# Patient Record
Sex: Female | Born: 1950
Health system: Southern US, Community
[De-identification: ages and names within clinical notes are randomized; demographics above are authoritative.]

## PROBLEM LIST (undated history)

## (undated) DIAGNOSIS — E215 Disorder of parathyroid gland, unspecified: Secondary | ICD-10-CM

## (undated) DIAGNOSIS — Z803 Family history of malignant neoplasm of breast: Secondary | ICD-10-CM

## (undated) DIAGNOSIS — M1990 Unspecified osteoarthritis, unspecified site: Secondary | ICD-10-CM

## (undated) DIAGNOSIS — I10 Essential (primary) hypertension: Secondary | ICD-10-CM

## (undated) DIAGNOSIS — Z923 Personal history of irradiation: Secondary | ICD-10-CM

## (undated) DIAGNOSIS — C801 Malignant (primary) neoplasm, unspecified: Secondary | ICD-10-CM

## (undated) DIAGNOSIS — Z8041 Family history of malignant neoplasm of ovary: Secondary | ICD-10-CM

## (undated) DIAGNOSIS — D649 Anemia, unspecified: Secondary | ICD-10-CM

## (undated) DIAGNOSIS — Z800 Family history of malignant neoplasm of digestive organs: Secondary | ICD-10-CM

## (undated) HISTORY — PX: HYSTERECTOMY ABDOMINAL WITH SALPINGECTOMY: SHX6725

## (undated) HISTORY — PX: BREAST LUMPECTOMY: SHX2

## (undated) HISTORY — DX: Family history of malignant neoplasm of digestive organs: Z80.0

## (undated) HISTORY — PX: WISDOM TOOTH EXTRACTION: SHX21

## (undated) HISTORY — DX: Family history of malignant neoplasm of breast: Z80.3

## (undated) HISTORY — PX: ABDOMINAL HYSTERECTOMY: SHX81

## (undated) HISTORY — PX: PARATHYROIDECTOMY: SHX19

## (undated) HISTORY — DX: Family history of malignant neoplasm of ovary: Z80.41

---

## 2018-03-23 ENCOUNTER — Other Ambulatory Visit: Payer: Self-pay | Admitting: Family Medicine

## 2018-03-23 DIAGNOSIS — C801 Malignant (primary) neoplasm, unspecified: Secondary | ICD-10-CM

## 2018-03-23 DIAGNOSIS — N6320 Unspecified lump in the left breast, unspecified quadrant: Secondary | ICD-10-CM

## 2018-03-23 HISTORY — DX: Malignant (primary) neoplasm, unspecified: C80.1

## 2018-03-29 ENCOUNTER — Other Ambulatory Visit: Payer: Self-pay | Admitting: Family Medicine

## 2018-03-29 DIAGNOSIS — N6320 Unspecified lump in the left breast, unspecified quadrant: Secondary | ICD-10-CM

## 2018-03-31 ENCOUNTER — Other Ambulatory Visit: Payer: Self-pay | Admitting: Family Medicine

## 2018-03-31 ENCOUNTER — Other Ambulatory Visit: Payer: Self-pay

## 2018-03-31 ENCOUNTER — Ambulatory Visit
Admission: RE | Admit: 2018-03-31 | Discharge: 2018-03-31 | Disposition: A | Discharge status: Home / Self Care | Payer: Medicare Other | Source: Ambulatory Visit | Attending: Family Medicine | Admitting: Family Medicine

## 2018-03-31 DIAGNOSIS — N6320 Unspecified lump in the left breast, unspecified quadrant: Secondary | ICD-10-CM

## 2018-04-05 ENCOUNTER — Encounter: Payer: Self-pay | Admitting: *Deleted

## 2018-04-05 DIAGNOSIS — Z170 Estrogen receptor positive status [ER+]: Principal | ICD-10-CM | POA: Insufficient documentation

## 2018-04-05 DIAGNOSIS — C50412 Malignant neoplasm of upper-outer quadrant of left female breast: Secondary | ICD-10-CM

## 2018-04-12 ENCOUNTER — Other Ambulatory Visit: Payer: Self-pay | Admitting: Surgery

## 2018-04-12 ENCOUNTER — Ambulatory Visit: Payer: Self-pay | Admitting: Surgery

## 2018-04-12 ENCOUNTER — Encounter: Payer: Self-pay | Admitting: Physical Therapy

## 2018-04-12 ENCOUNTER — Ambulatory Visit: Payer: Medicare Other | Attending: Surgery | Admitting: Physical Therapy

## 2018-04-12 ENCOUNTER — Other Ambulatory Visit: Payer: Self-pay

## 2018-04-12 ENCOUNTER — Ambulatory Visit
Admission: RE | Admit: 2018-04-12 | Discharge: 2018-04-12 | Disposition: A | Discharge status: Home / Self Care | Payer: Medicare Other | Source: Ambulatory Visit | Attending: Radiation Oncology | Admitting: Radiation Oncology

## 2018-04-12 ENCOUNTER — Inpatient Hospital Stay: Payer: Medicare Other

## 2018-04-12 ENCOUNTER — Inpatient Hospital Stay: Payer: Medicare Other | Attending: Hematology and Oncology | Admitting: Hematology and Oncology

## 2018-04-12 ENCOUNTER — Encounter: Payer: Self-pay | Admitting: *Deleted

## 2018-04-12 ENCOUNTER — Encounter: Payer: Self-pay | Admitting: Hematology and Oncology

## 2018-04-12 DIAGNOSIS — C50412 Malignant neoplasm of upper-outer quadrant of left female breast: Secondary | ICD-10-CM | POA: Diagnosis not present

## 2018-04-12 DIAGNOSIS — Z170 Estrogen receptor positive status [ER+]: Principal | ICD-10-CM

## 2018-04-12 DIAGNOSIS — C50912 Malignant neoplasm of unspecified site of left female breast: Secondary | ICD-10-CM

## 2018-04-12 DIAGNOSIS — R293 Abnormal posture: Secondary | ICD-10-CM

## 2018-04-12 LAB — CMP (CANCER CENTER ONLY)
ALBUMIN: 4.2 g/dL (ref 3.5–5.0)
ALT: 20 U/L (ref 0–44)
AST: 20 U/L (ref 15–41)
Alkaline Phosphatase: 76 U/L (ref 38–126)
Anion gap: 8 (ref 5–15)
BUN: 17 mg/dL (ref 8–23)
CO2: 27 mmol/L (ref 22–32)
CREATININE: 0.99 mg/dL (ref 0.44–1.00)
Calcium: 9.8 mg/dL (ref 8.9–10.3)
Chloride: 107 mmol/L (ref 98–111)
GFR, EST NON AFRICAN AMERICAN: 58 mL/min — AB (ref >60)
GFR, Est AFR Am: >60 mL/min (ref >60)
GLUCOSE: 106 mg/dL — AB (ref 70–99)
Potassium: 4.5 mmol/L (ref 3.5–5.1)
Sodium: 142 mmol/L (ref 135–145)
Total Bilirubin: 0.4 mg/dL (ref 0.3–1.2)
Total Protein: 7.4 g/dL (ref 6.5–8.1)

## 2018-04-12 LAB — CBC WITH DIFFERENTIAL (CANCER CENTER ONLY)
Basophils Absolute: 0 10*3/uL (ref 0.0–0.1)
Basophils Relative: 1 %
EOS ABS: 0.2 10*3/uL (ref 0.0–0.5)
EOS PCT: 3 %
HCT: 40.5 % (ref 34.8–46.6)
Hemoglobin: 13.9 g/dL (ref 11.6–15.9)
Lymphocytes Relative: 30 %
Lymphs Abs: 1.4 10*3/uL (ref 0.9–3.3)
MCH: 29.6 pg (ref 25.1–34.0)
MCHC: 34.3 g/dL (ref 31.5–36.0)
MCV: 86.4 fL (ref 79.5–101.0)
MONO ABS: 0.4 10*3/uL (ref 0.1–0.9)
MONOS PCT: 8 %
Neutro Abs: 2.7 10*3/uL (ref 1.5–6.5)
Neutrophils Relative %: 58 %
PLATELETS: 210 10*3/uL (ref 145–400)
RBC: 4.69 MIL/uL (ref 3.70–5.45)
RDW: 13.3 % (ref 11.2–14.5)
WBC Count: 4.6 10*3/uL (ref 3.9–10.3)

## 2018-04-12 NOTE — Patient Instructions (Signed)

## 2018-04-12 NOTE — H&P (View-Only) (Signed)
Monica Abbott Documented: 04/12/2018 7:32 AM Location: [facility] Patient #: [id number] DOB: 01-06-1951 Undefined / Language: Monica Abbott / Race: White Female  History of Present Illness Monica Moores A. Garnet Overfield MD; 04/12/2018 10:46 AM) Patient words: 67 YO FEMALE sent at the request of Dr Lisbeth Renshaw for abnomal left mammogram. No hx of mass, pain or discharge. Mammogram and U/S showed a central 0.7 cm mass and adjacent mass 0.4 cm core biopsy to be IDC grade 1 ER POS PR POS HER 2 NEU NEGATIVE.         ADDITIONAL INFORMATION: PROGNOSTIC INDICATORS Results: IMMUNOHISTOCHEMICAL AND MORPHOMETRIC ANALYSIS PERFORMED MANUALLY By immunohistochemistry, the tumor cells are negative for Her2 (1+). Estrogen Receptor: 100%, POSITIVE, STRONG STAINING INTENSITY Progesterone Receptor: 100%, POSITIVE, STRONG STAINING INTENSITY Proliferation Marker Ki67: 10% REFERENCE RANGE ESTROGEN RECEPTOR NEGATIVE 0% POSITIVE =>1% REFERENCE RANGE PROGESTERONE RECEPTOR NEGATIVE 0% POSITIVE =>1% All controls stained appropriately Enid Cutter MD Pathologist, Electronic Signature ( Signed 04/04/2018) FINAL DIAGNOSIS Diagnosis Breast, left, needle core biopsy, 1 o'clock retroareolar - INVASIVE DUCTAL CARCINOMA, GRADE 1-2. SEE NOTE. 1 of 2 FINAL for AILA, TERRA ([id number]) Diagnosis Note Dr. Lyndon Code has reviewed this case and concurs with the above interpretation. Breast prognostic profile is pending and will be reported in an addendum. The [facility] was notified on 04/03/2018. (NK:ah 04/03/18) Jaquita Folds MD Pathologist, Electronic Signature (Case signed 04/03/2018).  The patient is a 67 year old female.   Past Surgical History Tawni Pummel, RN; 04/12/2018 7:32 AM) Hysterectomy (due to cancer) - Partial  Diagnostic Studies History Tawni Pummel, RN; 04/12/2018 7:32 AM) Pap Smear >5 years ago  Medication History Tawni Pummel, RN; 04/12/2018 7:32  AM) Medications Reconciled  Social History Tawni Pummel, RN; 04/12/2018 7:32 AM) Caffeine use Coffee, Tea. Tobacco use Never smoker.  Family History Tawni Pummel, RN; 04/12/2018 7:32 AM) Arthritis Father, Mother. Breast Cancer Family Members In General. Cervical Cancer Mother. Colon Cancer Father. Colon Polyps Father. Diabetes Mellitus Mother. Hypertension Father. Malignant Neoplasm Of Pancreas Sister. Ovarian Cancer Mother. Rectal Cancer Father. Thyroid problems Mother.  Pregnancy / Birth History Tawni Pummel, RN; 04/12/2018 7:32 AM) Contraceptive History Oral contraceptives. Gravida 2 Maternal age 38-20 Para 2  Other Problems Tawni Pummel, RN; 04/12/2018 7:32 AM) Anxiety Disorder Arthritis Back Pain Cholelithiasis High blood pressure     Review of Systems (Eupha Lobb A. Felise Georgia MD; 04/12/2018 10:46 AM) Gastrointestinal Present- Abdominal Pain. Not Present- Bloating, Bloody Stool, Change in Bowel Habits, Chronic diarrhea, Constipation, Difficulty Swallowing, Excessive gas, Gets full quickly at meals, Hemorrhoids, Indigestion, Nausea, Rectal Pain and Vomiting. All other systems negative   Physical Exam (Eshika Reckart A. Mayra Brahm MD; 04/12/2018 10:47 AM)  General Mental Status-Alert. General Appearance-Consistent with stated age. Hydration-Well hydrated. Voice-Normal.  Head and Neck Head-normocephalic, atraumatic with no lesions or palpable masses. Trachea-midline. Thyroid Gland Characteristics - normal size and consistency.  Eye Eyeball - Bilateral-Extraocular movements intact. Sclera/Conjunctiva - Bilateral-No scleral icterus.  Chest and Lung Exam Chest and lung exam reveals -quiet, even and easy respiratory effort with no use of accessory muscles and on auscultation, normal breath sounds, no adventitious sounds and normal vocal resonance. Inspection Chest Wall - Normal. Back - normal.  Breast Breast -  Left-Symmetric, Non Tender, No Biopsy scars, no Dimpling, No Inflammation, No Lumpectomy scars, No Mastectomy scars, No Peau d' Orange. Breast - Right-Symmetric, Non Tender, No Biopsy scars, no Dimpling, No Inflammation, No Lumpectomy scars, No Mastectomy scars, No Peau d' Orange. Breast Lump-No Palpable Breast Mass.  Cardiovascular Cardiovascular examination reveals -  normal heart sounds, regular rate and rhythm with no murmurs and normal pedal pulses bilaterally.  Neurologic Neurologic evaluation reveals -alert and oriented x 3 with no impairment of recent or remote memory. Mental Status-Normal.  Musculoskeletal Normal Exam - Left-Upper Extremity Strength Normal and Lower Extremity Strength Normal. Normal Exam - Right-Upper Extremity Strength Normal and Lower Extremity Strength Normal.  Lymphatic Head & Neck  General Head & Neck Lymphatics: Bilateral - Description - Normal. Axillary  General Axillary Region: Bilateral - Description - Normal. Tenderness - Non Tender.    Assessment & Plan (Toben Acuna A. Akaya Proffit MD; 04/12/2018 10:48 AM)  BREAST CANCER, LEFT (C50.912) Impression: pt has opted for left breast lumpectomy and SLN mapping Seen at [facility] options fully discussed and long term expectations discussed  Risk of lumpectomy include bleeding, infection, seroma, more surgery, use of seed/wire, wound care, cosmetic deformity and the need for other treatments, death , blood clots, death. Pt agrees to proceed. Risk of sentinel lymph node mapping include bleeding, infection, lymphedema, shoulder pain. stiffness, dye allergy. cosmetic deformity , blood clots, death, need for more surgery. Pt agres to proceed.  Current Plans You are being scheduled for surgery- Our schedulers will call you.  You should hear from our office's scheduling department within 5 working days about the location, date, and time of surgery. We try to make accommodations for patient's preferences in  scheduling surgery, but sometimes the OR schedule or the surgeon's schedule prevents us from making those accommodations.  If you have not heard from our office ([phone]) in 5 working days, call the office and ask for your surgeon's nurse.  If you have other questions about your diagnosis, plan, or surgery, call the office and ask for your surgeon's nurse.  Pt Education - [facility] Breast Cancer Information Given - [facility] "Breast Journey" Package We discussed the staging and pathophysiology of breast cancer. We discussed all of the different options for treatment for breast cancer including surgery, chemotherapy, radiation therapy, Herceptin, and antiestrogen therapy. We discussed a sentinel lymph node biopsy as she does not appear to having lymph node involvement right now. We discussed the performance of that with injection of radioactive tracer and blue dye. We discussed that she would have an incision underneath her axillary hairline. We discussed that there is a bout a 10-20% chance of having a positive node with a sentinel lymph node biopsy and we will await the permanent pathology to make any other first further decisions in terms of her treatment. One of these options might be to return to the operating room to perform an axillary lymph node dissection. We discussed about a 1-2% risk lifetime of chronic shoulder pain as well as lymphedema associated with a sentinel lymph node biopsy. We discussed the options for treatment of the breast cancer which included lumpectomy versus a mastectomy. We discussed the performance of the lumpectomy with a wire placement. We discussed a 10-20% chance of a positive margin requiring reexcision in the operating room. We also discussed that she may need radiation therapy or antiestrogen therapy or both if she undergoes lumpectomy. We discussed the mastectomy and the postoperative care for that as well. We discussed that there is no difference in her survival whether  she undergoes lumpectomy with radiation therapy or antiestrogen therapy versus a mastectomy. There is a slight difference in the local recurrence rate being 3-5% with lumpectomy and about 1% with a mastectomy. We discussed the risks of operation including bleeding, infection, possible reoperation. She understands her further therapy   will be based on what her stages at the time of her operation.  Pt Education - [facility] breast cancer surgery: discussed with patient and provided information. Pt Education - [facility] Breast Biopsy HCI: discussed with patient and provided information. Pt Education - ABC (After Breast Cancer) Class Info: discussed with patient and provided information. Pt Education - [facility] Breast Pains Education

## 2018-04-12 NOTE — Therapy (Signed)
[place], [place], [address] Phone: [phone]   Fax:  [phone]  Physical Therapy Evaluation  Patient Details  Name: Monica Abbott MRN: [id number] Date of Birth: May 05, 1951 Referring Provider: Dr. Erroll Luna   Encounter Date: 04/12/2018  PT End of Session - 04/12/18 1138    Visit Number  1    Number of Visits  2    Date for PT Re-Evaluation  06/07/18    PT Start Time  9242    PT Stop Time  6834   Also saw pt from [id number] for a total of 25 min   PT Time Calculation (min)  11 min    Activity Tolerance  Patient tolerated treatment well    Behavior During Therapy  [facility] for tasks assessed/performed       History reviewed. No pertinent past medical history.  Past Surgical History:  Procedure Laterality Date  . HYSTERECTOMY ABDOMINAL WITH SALPINGECTOMY    . PARATHYROIDECTOMY      There were no vitals filed for this visit.   Subjective Assessment - 04/12/18 1132    Subjective  Patient reports she is here today to be seen by her medical team for her newly diagnosed left breast cancer.    Patient is accompained by:  Family member    Pertinent History  Patient was diagnosed on 02/24/18 with left grade I invasive ductal carcinoma breast cancer. There are 2 small areas measuring 7 mm and 4 mm located in the upper outer quadrant. They are ER/PR positive and HER2 negative with a Ki67 of 10%.     Patient Stated Goals  Reduce lymphedema risk and learn post op shoulder ROM HEP    Currently in Pain?  Yes    Pain Score  5     Pain Location  Knee    Pain Orientation  Left;Right    Pain Descriptors / Indicators  Aching    Pain Type  Chronic pain    Pain Onset  More than a month ago    Pain Frequency  Intermittent    Aggravating Factors   Standing    Pain Relieving Factors  Rest or moving around; walking    Multiple Pain Sites  No         [facility] PT Assessment - 04/12/18 0001      Assessment   Medical Diagnosis  Left breast cancer    Referring Provider  Dr. Marcello Moores [place]    Onset Date/Surgical Date  02/24/18    Hand Dominance  Right    Prior Therapy  none      Precautions   Precautions  Other (comment)    Precaution Comments  active cancer      Restrictions   Weight Bearing Restrictions  No      Balance Screen   Has the patient fallen in the past 6 months  No    Has the patient had a decrease in activity level because of a fear of falling?   No    Is the patient reluctant to leave their home because of a fear of falling?   No      Home Environment   Living Environment  Private residence    Living Arrangements  Spouse/significant other    Available Help at Discharge  Family      Prior Function   Level of [place]  Retired    Leisure  She rides a bike or walks  4-5x/week for 15-20 min      Cognition   Overall Cognitive Status  Within Functional Limits for tasks assessed      Posture/Postural Control   Posture/Postural Control  Postural limitations    Postural Limitations  Rounded Shoulders;Forward head      ROM / Strength   AROM / PROM / Strength  AROM;Strength      AROM   AROM Assessment Site  Shoulder;Cervical    Right/Left Shoulder  Right;Left    Right Shoulder Extension  50 Degrees    Right Shoulder Flexion  152 Degrees    Right Shoulder ABduction  167 Degrees    Right Shoulder Internal Rotation  67 Degrees    Right Shoulder External Rotation  85 Degrees    Left Shoulder Extension  48 Degrees    Left Shoulder Flexion  152 Degrees    Left Shoulder ABduction  158 Degrees    Left Shoulder Internal Rotation  63 Degrees    Left Shoulder External Rotation  75 Degrees    Cervical Flexion  WNL    Cervical Extension  WNL    Cervical - Right Side Bend  WNL    Cervical - Left Side Bend  WNL    Cervical - Right Rotation  WNL    Cervical - Left Rotation  WNL      Strength   Overall Strength  Within functional limits for tasks  performed        LYMPHEDEMA/ONCOLOGY QUESTIONNAIRE - 04/12/18 1137      Type   Cancer Type  Left breast cancer      Lymphedema Assessments   Lymphedema Assessments  Upper extremities      Right Upper Extremity Lymphedema   10 cm Proximal to Olecranon Process  28.8 cm    Olecranon Process  23.2 cm    10 cm Proximal to Ulnar Styloid Process  23.5 cm    Just Proximal to Ulnar Styloid Process  15.8 cm    Across Hand at [employer]  18.5 cm    At [place] of 2nd Digit  6 cm      Left Upper Extremity Lymphedema   10 cm Proximal to Olecranon Process  29.7 cm    Olecranon Process  23.2 cm    10 cm Proximal to Ulnar Styloid Process  22.8 cm    Just Proximal to Ulnar Styloid Process  15.7 cm    Across Hand at [employer]  18 cm    At [place] of 2nd Digit  5.8 cm             Objective measurements completed on examination: See above findings.       Patient was instructed today in a home exercise program today for post op shoulder range of motion. These included active assist shoulder flexion in sitting, scapular retraction, wall walking with shoulder abduction, and hands behind head external rotation.  She was encouraged to do these twice a day, holding 3 seconds and repeating 5 times when permitted by her physician.           PT Education - 04/12/18 1138    Education Details  Lymphedema risk reduction and post op shoulder ROM HEP    Person(s) Educated  Patient;Spouse    Methods  Explanation;Demonstration;Handout    Comprehension  Returned demonstration;Verbalized understanding          PT Long Term Goals - 04/12/18 1142      PT LONG TERM GOAL #  1   Title  Patient will demonstrate she has returned to baseline related to shoulder ROM and function post operatively.    Time  [facility] Goals - 04/12/18 1142      Patient will be able to verbalize understanding of pertinent lymphedema risk reduction practices  relevant to her diagnosis specifically related to skin care.   Time  1    Period  Days    Status  Achieved      Patient will be able to return demonstrate and/or verbalize understanding of the post-op home exercise program related to regaining shoulder range of motion.   Time  1    Period  Days    Status  Achieved      Patient will be able to verbalize understanding of the importance of attending the postoperative After Breast Cancer Class for further lymphedema risk reduction education and therapeutic exercise.   Time  1    Period  Days    Status  Achieved            Plan - 04/12/18 1139    Clinical Impression Statement  Patient was diagnosed on 02/24/18 with left grade I invasive ductal carcinoma breast cancer. There are 2 small areas measuring 7 mm and 4 mm located in the upper outer quadrant. They are ER/PR positive and HER2 negative with a Ki67 of 10%.  Her multidisciplinary medical team met prior to her assessments to determine a recommended treatment plan. She is planning to have a left lumpectomy and sentinel node biopsy followed by Oncotype testing, radiaiton, and anti-estrogen therapy. She will benefit from a post op PT visit to reassess and determine needs.    Clinical Presentation  Stable    Clinical Decision Making  Low    Rehab Potential  Excellent    Clinical Impairments Affecting Rehab Potential  None    PT Frequency  --   Eval and 1 f/u visit   PT Treatment/Interventions  ADLs/Self Care Home Management;Therapeutic exercise;Patient/family education    PT Next Visit Plan  Will reassess 3-4 weeks post op to determine needs    PT Home Exercise Plan  Post op shoulder ROM HEP     Consulted and Agree with Plan of Care  Patient;Family member/caregiver    Family Member Consulted  Husband       Patient will benefit from skilled therapeutic intervention in order to improve the following deficits and impairments:  Impaired UE functional use, Decreased range of motion, Pain,  Decreased knowledge of precautions, Postural dysfunction  Visit Diagnosis: Malignant neoplasm of upper-outer quadrant of left breast in female, estrogen receptor positive ([place]) - Plan: PT plan of care cert/re-cert  Abnormal posture - Plan: PT plan of care cert/re-cert   Patient will follow up at outpatient cancer rehab 3-4 weeks following surgery.  If the patient requires physical therapy at that time, a specific plan will be dictated and sent to the referring physician for approval. The patient was educated today on appropriate basic range of motion exercises to begin post operatively and the importance of attending the After Breast Cancer class following surgery.  Patient was educated today on lymphedema risk reduction practices as it pertains to recommendations that will benefit the patient immediately following surgery.  She verbalized good understanding.    Problem List Patient Active Problem List   Diagnosis Date Noted  . Malignant neoplasm of upper-outer  quadrant of left breast in female, estrogen receptor positive ([place]) 04/05/2018    Annia Friendly, PT 04/12/18 11:45 AM   [place], [place], [address] Phone: [phone]   Fax:  [phone]  Name: Monica Abbott MRN: [id number] Date of Birth: 10/06/1950

## 2018-04-12 NOTE — Progress Notes (Signed)
[place] CONSULT NOTE  Patient Care Team: Claudine Mouton, FNP as PCP - General (Family Medicine) Erroll Luna, MD as Consulting Physician (General Surgery) Nicholas Lose, MD as Consulting Physician (Hematology and Oncology) Kyung Rudd, MD as Consulting Physician (Radiation Oncology)  CHIEF COMPLAINTS/PURPOSE OF CONSULTATION:  Newly diagnosed breast cancer  HISTORY OF PRESENTING ILLNESS:  Monica Abbott 67 y.o. female is here because of recent diagnosis of left breast cancer.  Patient had a routine screening mammogram that detected a retroareolar lesion which was solid and cystic in nature measuring 0.7 cm.  There was an adjacent mass measuring 0.4 cm which was not biopsied.  She was presented this morning to the multidisciplinary tumor board and she is here today to discuss her treatment plan.  I reviewed her records extensively and collaborated the history with the patient.  SUMMARY OF ONCOLOGIC HISTORY:   Malignant neoplasm of upper-outer quadrant of left breast in female, estrogen receptor positive ([place])   03/31/2018 Initial Diagnosis    Screening mammogram detected retroareolar solid and cystic mass at 1 o'clock position measuring 0.7 cm, adjacent smaller mass 0.4 cm not biopsied.  Biopsy of the retroareolar mass is grade 1 invasive ductal carcinoma ER 100%, PR 100%, Ki-67 10%, HER-2 negative, T1b N0 stage Ia AJCC 8     MEDICAL HISTORY:  History reviewed. No pertinent past medical history.  SURGICAL HISTORY: Past Surgical History:  Procedure Laterality Date  . HYSTERECTOMY ABDOMINAL WITH SALPINGECTOMY    . PARATHYROIDECTOMY      SOCIAL HISTORY: Social History   Socioeconomic History  . Marital status: Unknown    Spouse name: Not on file  . Number of children: Not on file  . Years of education: Not on file  . Highest education level: Not on file  Occupational History  . Not on file  Social Needs  . Financial resource strain: Not on file  .  Food insecurity:    Worry: Not on file    Inability: Not on file  . Transportation needs:    Medical: Not on file    Non-medical: Not on file  Tobacco Use  . Smoking status: Never Smoker  . Smokeless tobacco: Never Used  Substance and Sexual Activity  . Alcohol use: Never    Frequency: Never  . Drug use: Never  . Sexual activity: Not on file  Lifestyle  . Physical activity:    Days per week: Not on file    Minutes per session: Not on file  . Stress: Not on file  Relationships  . Social connections:    Talks on phone: Not on file    Gets together: Not on file    Attends religious service: Not on file    Active member of club or organization: Not on file    Attends meetings of clubs or organizations: Not on file    Relationship status: Not on file  . Intimate partner violence:    Fear of current or ex partner: Not on file    Emotionally abused: Not on file    Physically abused: Not on file    Forced sexual activity: Not on file  Other Topics Concern  . Not on file  Social History Narrative  . Not on file    FAMILY HISTORY: Family History  Problem Relation Age of Onset  . Ovarian cancer Mother   . Colon cancer Father   . Pancreatic cancer Sister   . Bladder Cancer Brother   . Kidney cancer  Brother     ALLERGIES:  has No Known Allergies.  MEDICATIONS:  Current Outpatient Medications  Medication Sig Dispense Refill  . atorvastatin (LIPITOR) 10 MG tablet Take 10 mg by mouth daily.    . Cholecalciferol (VITAMIN D PO) Take 25 mcg by mouth daily.    Marland Kitchen lisinopril (PRINIVIL,ZESTRIL) 10 MG tablet Take 10 mg by mouth daily.    . meloxicam (MOBIC) 15 MG tablet Take 15 mg by mouth daily.    . Multiple Vitamin (MULTIVITAMIN WITH MINERALS) TABS tablet Take 1 tablet by mouth daily.    . Omega-3 Fatty Acids (FISH OIL) 1000 MG CPDR Take 1,000 mg by mouth daily.    Marland Kitchen oxybutynin (DITROPAN) 5 MG tablet Take 5 mg by mouth 2 (two) times daily.     No current facility-administered  medications for this visit.     REVIEW OF SYSTEMS:   Constitutional: Denies fevers, chills or abnormal night sweats Eyes: Denies blurriness of vision, double vision or watery eyes Ears, nose, mouth, throat, and face: Denies mucositis or sore throat Respiratory: Denies cough, dyspnea or wheezes Cardiovascular: Denies palpitation, chest discomfort or lower extremity swelling Gastrointestinal:  Denies nausea, heartburn or change in bowel habits Skin: Denies abnormal skin rashes Lymphatics: Denies new lymphadenopathy or easy bruising Neurological:Denies numbness, tingling or new weaknesses Behavioral/Psych: Mood is stable, no new changes  Breast:  Denies any palpable lumps or discharge All other systems were reviewed with the patient and are negative.  PHYSICAL EXAMINATION: ECOG PERFORMANCE STATUS: 0 - Asymptomatic  Vitals:   04/12/18 0919  BP: (!) 173/71  Pulse: (!) 57  Resp: 17  Temp: 98.6 F (37 C)  SpO2: 100%   Filed Weights   04/12/18 0919  Weight: 186 lb 12.8 oz (84.7 kg)    GENERAL:alert, no distress and comfortable SKIN: skin color, texture, turgor are normal, no rashes or significant lesions EYES: normal, conjunctiva are pink and non-injected, sclera clear OROPHARYNX:no exudate, no erythema and lips, buccal mucosa, and tongue normal  NECK: supple, thyroid normal size, non-tender, without nodularity LYMPH:  no palpable lymphadenopathy in the cervical, axillary or inguinal LUNGS: clear to auscultation and percussion with normal breathing effort HEART: regular rate & rhythm and no murmurs and no lower extremity edema ABDOMEN:abdomen soft, non-tender and normal bowel sounds Musculoskeletal:no cyanosis of digits and no clubbing  PSYCH: alert & oriented x 3 with fluent speech NEURO: no focal motor/sensory deficits BREAST: No palpable nodules in breast. No palpable axillary or supraclavicular lymphadenopathy (exam performed in the presence of a chaperone)   LABORATORY  DATA:  I have reviewed the data as listed Lab Results  Component Value Date   WBC 4.6 04/12/2018   HGB 13.9 04/12/2018   HCT 40.5 04/12/2018   MCV 86.4 04/12/2018   PLT 210 04/12/2018   Lab Results  Component Value Date   NA 142 04/12/2018   K 4.5 04/12/2018   CL 107 04/12/2018   CO2 27 04/12/2018    RADIOGRAPHIC STUDIES: I have personally reviewed the radiological reports and agreed with the findings in the report.  ASSESSMENT AND PLAN:  Malignant neoplasm of upper-outer quadrant of left breast in female, estrogen receptor positive ([place]) 03/31/2018:Screening mammogram detected retroareolar solid and cystic mass at 1 o'clock position measuring 0.7 cm, adjacent smaller mass 0.4 cm not biopsied.  Biopsy of the retroareolar mass is grade 1 invasive ductal carcinoma ER 100%, PR 100%, Ki-67 10%, HER-2 negative, T1b N0 stage Ia AJCC 8  Pathology and radiology counseling:Discussed with  the patient, the details of pathology including the type of breast cancer,the clinical staging, the significance of ER, PR and HER-2/neu receptors and the implications for treatment. After reviewing the pathology in detail, we proceeded to discuss the different treatment options between surgery, radiation, chemotherapy, antiestrogen therapies.  Recommendations: 1. Breast conserving surgery followed by 2. Oncotype DX testing to determine if chemotherapy would be of any benefit if the tumor size is greater than 1 cm followed by 3. Adjuvant radiation therapy followed by 4. Adjuvant antiestrogen therapy  Oncotype counseling: I did not go into too much detail regarding Oncotype DX.  We briefly discussed this.  Return to clinic after surgery to discuss final pathology report and then determine if Oncotype DX testing will need to be sent.     All questions were answered. The patient knows to call the clinic with any problems, questions or concerns.    Harriette Ohara, MD 04/12/18

## 2018-04-12 NOTE — Progress Notes (Signed)
Radiation Oncology         [phone] ________________________________  Name: Monica Abbott        MRN: [id number]  Date of Service: 04/12/2018 DOB: [id number]  FI:EPPIRJ, Milana Na, FNP  Erroll Luna, MD     REFERRING PHYSICIAN: Erroll Luna, MD   DIAGNOSIS: The encounter diagnosis was Malignant neoplasm of upper-outer quadrant of left breast in female, estrogen receptor positive ([place]).   HISTORY OF PRESENT ILLNESS: Monica Abbott is a 67 y.o. female seen in the multidisciplinary breast clinic for a new diagnosis of left breast cancer. The patient was noted to have a screening detected mass on mammography. She presented to the [facility] and had diagnostic imaging which revealed a 7 x 7 x 5 mm mass in the 1:00 position of the left breast, and adjacent to this was a 4 mm mass with calcifications. Her axilla was negative for adenopathy. She had a biopsy of the 7 mm lesion on 03/31/18, and the 4 mm area could not be biopsied due to proximity of the skin. The 7 mm area revealed a grade 1, invasive ductal carcinoma, ER/PR positive, HER2 negative, and Ki 67 was 10%. She comes today to discuss options of treatment for her cancer.   PREVIOUS RADIATION THERAPY: No   PAST MEDICAL HISTORY: No past medical history on file.     PAST SURGICAL HISTORY: Past Surgical History:  Procedure Laterality Date  . HYSTERECTOMY ABDOMINAL WITH SALPINGECTOMY    . PARATHYROIDECTOMY      FAMILY HISTORY:  Family History  Problem Relation Age of Onset  . Ovarian cancer Mother   . Colon cancer Father   . Pancreatic cancer Sister   . Bladder Cancer Brother   . Kidney cancer Brother      SOCIAL HISTORY:  reports that she has never smoked. She has never used smokeless tobacco. She reports that she does not drink alcohol or use drugs. The patient is married and lives in [place]. She is originally from [place]. Her son is a PA and works in a neurology subspecialty in [place].   ALLERGIES:  Patient has no known allergies.   MEDICATIONS:  Current Outpatient Medications  Medication Sig Dispense Refill  . atorvastatin (LIPITOR) 10 MG tablet Take 10 mg by mouth daily.    . Cholecalciferol (VITAMIN D PO) Take 25 mcg by mouth daily.    Marland Kitchen lisinopril (PRINIVIL,ZESTRIL) 10 MG tablet Take 10 mg by mouth daily.    . meloxicam (MOBIC) 15 MG tablet Take 15 mg by mouth daily.    . Multiple Vitamin (MULTIVITAMIN WITH MINERALS) TABS tablet Take 1 tablet by mouth daily.    . Omega-3 Fatty Acids (FISH OIL) 1000 MG CPDR Take 1,000 mg by mouth daily.    Marland Kitchen oxybutynin (DITROPAN) 5 MG tablet Take 5 mg by mouth 2 (two) times daily.     No current facility-administered medications for this encounter.      REVIEW OF SYSTEMS: On review of systems, the patient reports that she is doing well overall. She denies any chest pain, shortness of breath, cough, fevers, chills, night sweats, unintended weight changes. She denies any bowel or bladder disturbances, and denies abdominal pain, nausea or vomiting. She denies any new musculoskeletal or joint aches or pains. A complete review of systems is obtained and is otherwise negative.     PHYSICAL EXAM:  Wt Readings from Last 3 Encounters:  04/12/18 186 lb 12.8 oz (84.7 kg)   Temp Readings from Last 3  Encounters:  04/12/18 98.6 F (37 C) (Oral)   BP Readings from Last 3 Encounters:  04/12/18 (!) 173/71   Pulse Readings from Last 3 Encounters:  04/12/18 (!) 57     In general this is a well appearing caucasian female in no acute distress. She is alert and oriented x4 and appropriate throughout the examination. HEENT reveals that the patient is normocephalic, atraumatic. EOMs are intact. Skin is intact without any evidence of gross lesions. Cardiovascular exam reveals a regular rate and rhythm, no clicks rubs or murmurs are auscultated. Chest is clear to auscultation bilaterally. Lymphatic assessment is performed and does not reveal any adenopathy in the  cervical, supraclavicular, axillary, or inguinal chains. Bilateral breast exam is performed and reveals mild ecchymosis of the left breast along the prior biopsy site. No palpable mass is noted of the right breast, and no nipple bleeding or discharge is noted in either breast. Abdomen has active bowel sounds in all quadrants and is intact. The abdomen is soft, non tender, non distended. Lower extremities are negative for pretibial pitting edema, deep calf tenderness, cyanosis or clubbing.   ECOG = 0  0 - Asymptomatic (Fully active, able to carry on all predisease activities without restriction)  1 - Symptomatic but completely ambulatory (Restricted in physically strenuous activity but ambulatory and able to carry out work of a light or sedentary nature. For example, light housework, office work)  2 - Symptomatic, <50% in bed during the day (Ambulatory and capable of all self care but unable to carry out any work activities. Up and about more than 50% of waking hours)  3 - Symptomatic, >50% in bed, but not bedbound (Capable of only limited self-care, confined to bed or chair 50% or more of waking hours)  4 - Bedbound (Completely disabled. Cannot carry on any self-care. Totally confined to bed or chair)  5 - Death   Eustace Pen MM, Creech RH, Tormey DC, et al. [phone]). "Toxicity and response criteria of the [facility] Group". [place] Oncol. 5 (6): 649-55    LABORATORY DATA:  Lab Results  Component Value Date   WBC 4.6 04/12/2018   HGB 13.9 04/12/2018   HCT 40.5 04/12/2018   MCV 86.4 04/12/2018   PLT 210 04/12/2018   Lab Results  Component Value Date   NA 142 04/12/2018   K 4.5 04/12/2018   CL 107 04/12/2018   CO2 27 04/12/2018   Lab Results  Component Value Date   ALT 20 04/12/2018   AST 20 04/12/2018   ALKPHOS 76 04/12/2018   BILITOT 0.4 04/12/2018      RADIOGRAPHY: Mm Clip Placement Left  Result Date: 03/31/2018 CLINICAL DATA:  67 year old female status  post ultrasound-guided biopsy of the left breast. EXAM: DIAGNOSTIC LEFT MAMMOGRAM POST ULTRASOUND BIOPSY COMPARISON:  Previous exam(s). FINDINGS: Mammographic images were obtained following ultrasound guided biopsy of a left breast mass. A ribbon shaped clip is identified in the subareolar left breast. This is in the expected location status post ultrasound-guided biopsy. IMPRESSION: Ribbon shaped clip in the expected location status post ultrasound-guided biopsy of the left breast. Final Assessment: Post Procedure Mammograms for Marker Placement Electronically Signed   By: Kristopher Oppenheim M.D.   On: 03/31/2018 15:25   [place] Lt Breast Bx W Loc Dev 1st Lesion Img Bx Spec US Guide  Addendum Date: 04/04/2018   ADDENDUM REPORT: 04/04/2018 15:26 ADDENDUM: Pathology revealed GRADE I-II INVASIVE DUCTAL CARCINOMA of the Left breast, 1 o'clock retroareolar. This was  found to be concordant by Dr. Kristopher Oppenheim. Pathology results were discussed with the patient by telephone. The patient reported doing well after the biopsy with tenderness at the site. Post biopsy instructions and care were reviewed and questions were answered. The patient was encouraged to call [facility] for any additional concerns. The patient was referred to [facility] on April 12, 2018. Please note, there is an additional mass approximately 4 mm away from biopsied lesion abutting the skin. Wide surgical excision is recommended. Pathology results reported by Terie Purser, RN on 04/04/2018. Electronically Signed   By: Kristopher Oppenheim M.D.   On: 04/04/2018 15:26   Result Date: 04/04/2018 CLINICAL DATA:  67 year old female with a suspicious left breast mass. EXAM: ULTRASOUND GUIDED LEFT BREAST CORE NEEDLE BIOPSY COMPARISON:  Previous exam(s). FINDINGS: I met with the patient and we discussed the procedure of ultrasound-guided biopsy, including benefits  and alternatives. We discussed the high likelihood of a successful procedure. We discussed the risks of the procedure, including infection, bleeding, tissue injury, clip migration, and inadequate sampling. Informed written consent was given. The usual time-out protocol was performed immediately prior to the procedure. Lesion quadrant: Upper-outer quadrant Using sterile technique and 1% Lidocaine as local anesthetic, under direct ultrasound visualization, a 12 gauge spring-loaded device was used to perform biopsy of a mass at the 1 o'clock retroareolar position using a inferior approach. At the conclusion of the procedure a ribbon shaped tissue marker clip was deployed into the biopsy cavity. Follow up 2 view mammogram was performed and dictated separately. IMPRESSION: Ultrasound guided biopsy of a suspicious left breast mass. No apparent complications. Electronically Signed: By: Kristopher Oppenheim M.D. On: 03/31/2018 15:25       IMPRESSION/PLAN: 1. Stage IA, [id number], grade 1 ER/PR positive invasive ductal carcinoma of the left breast. Dr. Lisbeth Renshaw discusses the pathology findings and reviews the nature of left breast disease. The consensus from the breast conference includes breast conservation with lumpectomy with  sentinel node biopsy. Depending on the size of the final tumor measurements rendered by pathology, the tumor may be tested for Oncotype Dx score to determine a role for systemic therapy. Provided that chemotherapy is not indicated, the patient's course would then be followed by external radiotherapy to the breast followed by antiestrogen therapy. We discussed the risks, benefits, short, and long term effects of radiotherapy, and the patient is interested in proceeding. Dr. Lisbeth Renshaw discusses the delivery and logistics of radiotherapy and anticipates a course of 4 or 6 1/2 weeks of radiotherapy, she appears to be a candidate for 4 weeks with deep inspiration breath hold technique. We will see her back about 2  weeks after surgery to discuss the simulation process and anticipate we starting radiotherapy about 4-6 weeks after surgery.    The above documentation reflects my direct findings during this shared patient visit. Please see the separate note by Dr. Lisbeth Renshaw on this date for the remainder of the patient's plan of care.    Carola Rhine, PAC

## 2018-04-12 NOTE — H&P (Signed)
Monica Abbott Documented: 04/12/2018 7:32 AM Location: [facility] Patient #: [id number] DOB: 01-06-1951 Undefined / Language: [name] / Race: White Female  History of Present Illness Monica Moores A. Alexavier Tsutsui MD; 04/12/2018 10:46 AM) Patient words: 67 YO FEMALE sent at the request of Dr Monica Abbott for abnomal left mammogram. No hx of mass, pain or discharge. Mammogram and U/S showed a central 0.7 cm mass and adjacent mass 0.4 cm core biopsy to be IDC grade 1 ER POS PR POS HER 2 NEU NEGATIVE.         ADDITIONAL INFORMATION: PROGNOSTIC INDICATORS Results: IMMUNOHISTOCHEMICAL AND MORPHOMETRIC ANALYSIS PERFORMED MANUALLY By immunohistochemistry, the tumor cells are negative for Her2 (1+). Estrogen Receptor: 100%, POSITIVE, STRONG STAINING INTENSITY Progesterone Receptor: 100%, POSITIVE, STRONG STAINING INTENSITY Proliferation Marker Ki67: 10% REFERENCE RANGE ESTROGEN RECEPTOR NEGATIVE 0% POSITIVE =>1% REFERENCE RANGE PROGESTERONE RECEPTOR NEGATIVE 0% POSITIVE =>1% All controls stained appropriately Monica Cutter MD Pathologist, Electronic Signature ( Signed 04/04/2018) FINAL DIAGNOSIS Diagnosis Breast, left, needle core biopsy, 1 o'clock retroareolar - INVASIVE DUCTAL CARCINOMA, GRADE 1-2. SEE NOTE. 1 of 2 FINAL for Monica Abbott ([id number]) Diagnosis Note Monica Abbott has reviewed this case and concurs with the above interpretation. Breast prognostic profile is pending and will be reported in an addendum. The [facility] was notified on 04/03/2018. (NK:ah 04/03/18) Monica Folds MD Pathologist, Electronic Signature (Case signed 04/03/2018).  The patient is a 67 year old female.   Past Surgical History Monica Pummel, RN; 04/12/2018 7:32 AM) Hysterectomy (due to cancer) - Partial  Diagnostic Studies History Monica Pummel, RN; 04/12/2018 7:32 AM) Pap Smear >5 years ago  Medication History Monica Pummel, RN; 04/12/2018 7:32  AM) Medications Reconciled  Social History Monica Pummel, RN; 04/12/2018 7:32 AM) Caffeine use Coffee, Tea. Tobacco use Never smoker.  Family History Monica Pummel, RN; 04/12/2018 7:32 AM) Arthritis Father, Mother. Breast Cancer Family Members In General. Cervical Cancer Mother. Colon Cancer Father. Colon Polyps Father. Diabetes Mellitus Mother. Hypertension Father. Malignant Neoplasm Of Pancreas Sister. Ovarian Cancer Mother. Rectal Cancer Father. Thyroid problems Mother.  Pregnancy / Birth History Monica Pummel, RN; 04/12/2018 7:32 AM) Contraceptive History Oral contraceptives. Gravida 2 Maternal age 38-20 Para 2  Other Problems Monica Pummel, RN; 04/12/2018 7:32 AM) Anxiety Disorder Arthritis Back Pain Cholelithiasis High blood pressure     Review of Systems (Monica Simson A. Rolin Schult MD; 04/12/2018 10:46 AM) Gastrointestinal Present- Abdominal Pain. Not Present- Bloating, Bloody Stool, Change in Bowel Habits, Chronic diarrhea, Constipation, Difficulty Swallowing, Excessive gas, Gets full quickly at meals, Hemorrhoids, Indigestion, Nausea, Rectal Pain and Vomiting. All other systems negative   Physical Exam (Monica Paster A. Aiesha Leland MD; 04/12/2018 10:47 AM)  General Mental Status-Alert. General Appearance-Consistent with stated age. Hydration-Well hydrated. Voice-Normal.  Head and Neck Head-normocephalic, atraumatic with no lesions or palpable masses. Trachea-midline. Thyroid Gland Characteristics - normal size and consistency.  Eye Eyeball - Bilateral-Extraocular movements intact. Sclera/Conjunctiva - Bilateral-No scleral icterus.  Chest and Lung Exam Chest and lung exam reveals -quiet, even and easy respiratory effort with no use of accessory muscles and on auscultation, normal breath sounds, no adventitious sounds and normal vocal resonance. Inspection Chest Wall - Normal. Back - normal.  Breast Breast -  Left-Symmetric, Non Tender, No Biopsy scars, no Dimpling, No Inflammation, No Lumpectomy scars, No Mastectomy scars, No Peau d' Orange. Breast - Right-Symmetric, Non Tender, No Biopsy scars, no Dimpling, No Inflammation, No Lumpectomy scars, No Mastectomy scars, No Peau d' Orange. Breast Lump-No Palpable Breast Mass.  Cardiovascular Cardiovascular examination reveals -  normal heart sounds, regular rate and rhythm with no murmurs and normal pedal pulses bilaterally.  Neurologic Neurologic evaluation reveals -alert and oriented x 3 with no impairment of recent or remote memory. Mental Status-Normal.  Musculoskeletal Normal Exam - Left-Upper Extremity Strength Normal and Lower Extremity Strength Normal. Normal Exam - Right-Upper Extremity Strength Normal and Lower Extremity Strength Normal.  Lymphatic Head & Neck  General Head & Neck Lymphatics: Bilateral - Description - Normal. Axillary  General Axillary Region: Bilateral - Description - Normal. Tenderness - Non Tender.    Assessment & Plan (Monica Basham A. Kevron Patella MD; 04/12/2018 10:48 AM)  BREAST CANCER, LEFT (C50.912) Impression: pt has opted for left breast lumpectomy and SLN mapping Seen at [facility] options fully discussed and long term expectations discussed  Risk of lumpectomy include bleeding, infection, seroma, more surgery, use of seed/wire, wound care, cosmetic deformity and the need for other treatments, death , blood clots, death. Pt agrees to proceed. Risk of sentinel lymph node mapping include bleeding, infection, lymphedema, shoulder pain. stiffness, dye allergy. cosmetic deformity , blood clots, death, need for more surgery. Pt agres to proceed.  Current Plans You are being scheduled for surgery- Our schedulers will call you.  You should hear from our office's scheduling department within 5 working days about the location, date, and time of surgery. We try to make accommodations for patient's preferences in  scheduling surgery, but sometimes the OR schedule or the surgeon's schedule prevents [place] from making those accommodations.  If you have not heard from our office [phone]) in 5 working days, call the office and ask for your surgeon's nurse.  If you have other questions about your diagnosis, plan, or surgery, call the office and ask for your surgeon's nurse.  Pt Education - [facility] Breast Cancer Information Given - [facility] "Breast Journey" Package We discussed the staging and pathophysiology of breast cancer. We discussed all of the different options for treatment for breast cancer including surgery, chemotherapy, radiation therapy, Herceptin, and antiestrogen therapy. We discussed a sentinel lymph node biopsy as she does not appear to having lymph node involvement right now. We discussed the performance of that with injection of radioactive tracer and blue dye. We discussed that she would have an incision underneath her axillary hairline. We discussed that there is a bout a 10-20% chance of having a positive node with a sentinel lymph node biopsy and we will await the permanent pathology to make any other first further decisions in terms of her treatment. One of these options might be to return to the operating room to perform an axillary lymph node dissection. We discussed about a 1-2% risk lifetime of chronic shoulder pain as well as lymphedema associated with a sentinel lymph node biopsy. We discussed the options for treatment of the breast cancer which included lumpectomy versus a mastectomy. We discussed the performance of the lumpectomy with a wire placement. We discussed a 10-20% chance of a positive margin requiring reexcision in the operating room. We also discussed that she may need radiation therapy or antiestrogen therapy or both if she undergoes lumpectomy. We discussed the mastectomy and the postoperative care for that as well. We discussed that there is no difference in her survival whether  she undergoes lumpectomy with radiation therapy or antiestrogen therapy versus a mastectomy. There is a slight difference in the local recurrence rate being 3-5% with lumpectomy and about 1% with a mastectomy. We discussed the risks of operation including bleeding, infection, possible reoperation. She understands her further therapy  will be based on what her stages at the time of her operation.  Pt Education - flb breast cancer surgery: discussed with patient and provided information. Pt Education - [facility] Breast Biopsy HCI: discussed with patient and provided information. Pt Education - ABC (After Breast Cancer) Class Info: discussed with patient and provided information. Pt Education - [facility] Breast Pains Education

## 2018-04-12 NOTE — Assessment & Plan Note (Signed)
03/31/2018:Screening mammogram detected retroareolar solid and cystic mass at 1 o'clock position measuring 0.7 cm, adjacent smaller mass 0.4 cm not biopsied.  Biopsy of the retroareolar mass is grade 1 invasive ductal carcinoma ER 100%, PR 100%, Ki-67 10%, HER-2 negative, T1b N0 stage Ia AJCC 8  Pathology and radiology counseling:Discussed with the patient, the details of pathology including the type of breast cancer,the clinical staging, the significance of ER, PR and HER-2/neu receptors and the implications for treatment. After reviewing the pathology in detail, we proceeded to discuss the different treatment options between surgery, radiation, chemotherapy, antiestrogen therapies.  Recommendations: 1. Breast conserving surgery followed by 2. Oncotype DX testing to determine if chemotherapy would be of any benefit if the tumor size is greater than 1 cm followed by 3. Adjuvant radiation therapy followed by 4. Adjuvant antiestrogen therapy  Oncotype counseling: I did not go into too much detail regarding Oncotype DX.  We briefly discussed this.  Return to clinic after surgery to discuss final pathology report and then determine if Oncotype DX testing will need to be sent.   

## 2018-04-12 NOTE — Progress Notes (Signed)
Clinical Social Work [place] Psychosocial Distress Screening [place]  Patient completed distress screening protocol and scored a 9 on the Psychosocial Distress Thermometer which indicates severe distress. Clinical Social Worker met with patient and patients husband in [facility] to assess for distress and other psychosocial needs. Patient stated she was feeling overwhelmed but felt "better" after meeting with the treatment team and getting more information on her treatment plan. CSW and patient discussed common feeling and emotions when being diagnosed with cancer, and the importance of support during treatment. CSW informed patient of the support team and support services at [facility]. CSW provided contact information and encouraged patient to call with any questions or concerns.  ONCBCN DISTRESS SCREENING 04/12/2018  Screening Type Initial Screening  Distress experienced in past week (1-10) 9  Emotional problem type Nervousness/Anxiety;Adjusting to illness  Spiritual/Religous concerns type Facing my mortality  Information Concerns Type Lack of info about diagnosis;Lack of info about treatment;Lack of info about complementary therapy choices;Lack of info about maintaining fitness     Johnnye Lana, MSW, LCSW, OSW-C Clinical Social Worker [facility] [phone]

## 2018-04-13 ENCOUNTER — Telehealth: Payer: Self-pay | Admitting: Radiation Oncology

## 2018-04-13 NOTE — Telephone Encounter (Signed)
I called and spoke with the patient's son to make sure he knew about what our discussion was for treatment recommendations.

## 2018-04-14 ENCOUNTER — Telehealth: Payer: Self-pay | Admitting: *Deleted

## 2018-04-14 NOTE — Telephone Encounter (Signed)
Spoke to pt concerning [place] from 8.21.19. Denies questions or concerns regarding dx or treatment care plan. Encourage pt to call with needs. Received verbal understanding. Confirmed genetics appt date/time/location. Contact information provided for questions

## 2018-04-17 ENCOUNTER — Encounter: Payer: Self-pay | Admitting: Genetic Counselor

## 2018-04-17 ENCOUNTER — Inpatient Hospital Stay: Payer: Medicare Other

## 2018-04-17 ENCOUNTER — Inpatient Hospital Stay (HOSPITAL_BASED_OUTPATIENT_CLINIC_OR_DEPARTMENT_OTHER): Payer: Medicare Other | Admitting: Genetic Counselor

## 2018-04-17 DIAGNOSIS — Z803 Family history of malignant neoplasm of breast: Secondary | ICD-10-CM

## 2018-04-17 DIAGNOSIS — Z7183 Encounter for nonprocreative genetic counseling: Secondary | ICD-10-CM

## 2018-04-17 DIAGNOSIS — Z8041 Family history of malignant neoplasm of ovary: Secondary | ICD-10-CM | POA: Diagnosis not present

## 2018-04-17 DIAGNOSIS — Z800 Family history of malignant neoplasm of digestive organs: Secondary | ICD-10-CM | POA: Diagnosis not present

## 2018-04-17 DIAGNOSIS — Z170 Estrogen receptor positive status [ER+]: Principal | ICD-10-CM

## 2018-04-17 DIAGNOSIS — C50412 Malignant neoplasm of upper-outer quadrant of left female breast: Secondary | ICD-10-CM | POA: Diagnosis not present

## 2018-04-17 NOTE — Progress Notes (Signed)
REFERRING PROVIDER: Nicholas Lose, MD [address] [place], [place] [address]  PRIMARY PROVIDER:  Claudine Mouton, FNP  PRIMARY REASON FOR VISIT:  1. Malignant neoplasm of upper-outer quadrant of left breast in female, estrogen receptor positive ([place])   2. Family history of breast cancer   3. Family history of colon cancer   4. Family history of pancreatic cancer   5. Family history of ovarian cancer      HISTORY OF PRESENT ILLNESS:   Monica Abbott, a 67 y.o. female, was seen for a [facility] cancer genetics consultation at the request of Dr. Lindi Abbott due to a personal and family history of cancer.  Monica Abbott presents to clinic today to discuss the possibility of a hereditary predisposition to cancer, genetic testing, and to further clarify her future cancer risks, as well as potential cancer risks for family members.   In August 2019, at the age of 64, Monica Abbott was diagnosed with cancer of the left breast. This is planning to be treated with lumpectomy and Radiation.  The patient reports that her sister who had pancreatic cancer was going to have genetic testing but her doctor indicated that since she already had cancer, there was no point to do it.     CANCER HISTORY:    Malignant neoplasm of upper-outer quadrant of left breast in female, estrogen receptor positive ([place])   03/31/2018 Initial Diagnosis    Screening mammogram detected retroareolar solid and cystic mass at 1 o'clock position measuring 0.7 cm, adjacent smaller mass 0.4 cm not biopsied.  Biopsy of the retroareolar mass is grade 1 invasive ductal carcinoma ER 100%, PR 100%, Ki-67 10%, HER-2 negative, T1b N0 stage Ia AJCC 8      HORMONAL RISK FACTORS:  Menarche was at age 75.  First live birth at age 75.  OCP use for approximately 0 years.  Ovaries intact: patient has right ovary.  Hysterectomy: yes.  Menopausal status: postmenopausal.  HRT use: 0 years. Colonoscopy: yes; has had 4 polyps, is on a 5 year  schedule.. Mammogram within the last year: yes. Number of breast biopsies: 2. Up to date with pelvic exams:  yes. Any excessive radiation exposure in the past:  no  Past Medical History:  Diagnosis Date  . Family history of breast cancer   . Family history of colon cancer   . Family history of ovarian cancer   . Family history of pancreatic cancer     Past Surgical History:  Procedure Laterality Date  . HYSTERECTOMY ABDOMINAL WITH SALPINGECTOMY    . PARATHYROIDECTOMY      Social History   Socioeconomic History  . Marital status: Unknown    Spouse name: Not on file  . Number of children: Not on file  . Years of education: Not on file  . Highest education level: Not on file  Occupational History  . Not on file  Social Needs  . Financial resource strain: Not on file  . Food insecurity:    Worry: Not on file    Inability: Not on file  . Transportation needs:    Medical: Not on file    Non-medical: Not on file  Tobacco Use  . Smoking status: Never Smoker  . Smokeless tobacco: Never Used  Substance and Sexual Activity  . Alcohol use: Never    Frequency: Never  . Drug use: Never  . Sexual activity: Not on file  Lifestyle  . Physical activity:    Days per week: Not on  file    Minutes per session: Not on file  . Stress: Not on file  Relationships  . Social connections:    Talks on phone: Not on file    Gets together: Not on file    Attends religious service: Not on file    Active member of club or organization: Not on file    Attends meetings of clubs or organizations: Not on file    Relationship status: Not on file  Other Topics Concern  . Not on file  Social History Narrative  . Not on file     FAMILY HISTORY:  We obtained a detailed, 4-generation family history.  Significant diagnoses are listed below: Family History  Problem Relation Age of Onset  . Ovarian cancer Mother   . Colon cancer Father   . Pancreatic cancer Sister 52  . Bladder Cancer  Brother 66       smoker  . Kidney cancer Brother 12  . Colon cancer Maternal Aunt        dx in her 68s  . Cancer Maternal Uncle        NOS  . Breast cancer Paternal Aunt   . Lung cancer Paternal Uncle        non-smoker  . Diabetes Maternal Grandmother   . Stroke Maternal Grandmother   . Colon cancer Maternal Grandfather   . Cancer Paternal Grandfather        NOS  . Skin cancer Sister 57  . Breast cancer Maternal Aunt        dx under 15s    The patient has three children, two sons and a daughter.  Her daughter was adopted.  Both sons are cancer free.  She has three sisters and four brothers.  One brother had bladder cancer, one brother has kidney cancer, a sister had pancreatic cancer and another sister had skin cancer.  Both parents are deceased.  The patient's mother had ovarian cancer in her 42's.  She was one of 13 children.  The patient's mother was one of the youngest children, so there is not a lot of information about the cancer in the family, but she states that one of her sisters had colon cancer in her 57's and died and another sister had breast cancer under 20.  One uncle had an unknown form of cancer.  The maternal grandparents are deceased.  The grandfather died of colon cancer and the grandmother had a stroke.  The patient's father had breast cancer, colon cancer and lung cancer.  He died at 65.  He was one of 8 children.  He had one sister who had breast cancer and a sister with lung cancer.  There may have been others with cancer.  The patient's paternal grandfather had an unknown cancer, and the grandmother died in her 88's.  [name] is unaware of previous family history of genetic testing for hereditary cancer risks. Patient's maternal ancestors are of [place] descent, and paternal ancestors are of [place] descent. There is no reported Ashkenazi Jewish ancestry. There is no known consanguinity.  GENETIC COUNSELING ASSESSMENT: Monica Abbott is a 67 y.o. female with a  personal and family history of cancer which is somewhat suggestive of a hereditary cancer syndrome such as hereditary breast and ovarian cancer or Lynch syndrome and predisposition to cancer. We, therefore, discussed and recommended the following at today's visit.   DISCUSSION: We disucssed that about 5-10% of breast cancer is hereditary with most cases due to BRCA mutations.  There are other genes, such as ATM, CHEK2 and PALB2, that can increase the risk for hereditary breast cancer, but with a father with breast cancer we would be most concerned about BRCA, PALB2 or CHEK2.  Based on the patient's mother's side of the family, there is concern for possible Lynch syndrome.  The patient's mother had ovarian cancer, and her maternal aunt and grandfather had colon cancer.  This is consistent with a condition such as Lynch syndrome.    We reviewed the characteristics, features and inheritance patterns of hereditary cancer syndromes. We also discussed genetic testing, including the appropriate family members to test, the process of testing, insurance coverage and turn-around-time for results. We discussed the implications of a negative, positive and/or variant of uncertain significant result. We recommended Ms. Brownlow pursue genetic testing for the STAT panel, and then reflex to the Multi cancer gene panel. The Multi-Gene Panel offered by [facility] includes sequencing and/or deletion duplication testing of the following 84 genes: AIP, ALK, APC, ATM, AXIN2,BAP1,  BARD1, BLM, BMPR1A, BRCA1, BRCA2, BRIP1, CASR, CDC73, CDH1, CDK4, CDKN1B, CDKN1C, CDKN2A (p14ARF), CDKN2A (p16INK4a), CEBPA, CHEK2, CTNNA1, DICER1, DIS3L2, EGFR (c.2369C>T, p.Thr790Met variant only), EPCAM (Deletion/duplication testing only), FH, FLCN, GATA2, GPC3, GREM1 (Promoter region deletion/duplication testing only), HOXB13 (c.251G>A, p.Gly84Glu), HRAS, KIT, MAX, MEN1, MET, MITF (c.952G>A, p.Glu318Lys variant only), MLH1, MSH2, MSH3, MSH6, MUTYH, NBN, NF1,  NF2, NTHL1, PALB2, PDGFRA, PHOX2B, PMS2, POLD1, POLE, POT1, PRKAR1A, PTCH1, PTEN, RAD50, RAD51C, RAD51D, RB1, RECQL4, RET, RUNX1, SDHAF2, SDHA (sequence changes only), SDHB, SDHC, SDHD, SMAD4, SMARCA4, SMARCB1, SMARCE1, STK11, SUFU, TERC, TERT, TMEM127, TP53, TSC1, TSC2, VHL, WRN and WT1.    Based on Ms. Ventress's personal and family history of cancer, she meets medical criteria for genetic testing. Despite that she meets criteria, she may still have an out of pocket cost. We discussed that if her out of pocket cost for testing is over $100, the laboratory will call and confirm whether she wants to proceed with testing.  If the out of pocket cost of testing is less than $100 she will be billed by the genetic testing laboratory.   In order to estimate her chance of having a BRCA mutation, we used statistical models (Penn II) and laboratory data that take into account her personal medical history, family history and ancestry.  Because each model is different, there can be a lot of variability in the risks they give.  Therefore, these numbers must be considered a rough range and not a precise risk of having a BRCA mutation.  These models estimate that she has approximately a 31% chance of having a mutation. Based on this assessment of her family and personal history, genetic testing is recommended.   In order to estimate her chance of having a Lynch syndrome mutation, we used statistical models (PREMM1,2,6) and laboratory data that take into account her personal medical history, family history and ancestry.  Because each model is different, there can be a lot of variability in the risks they give.  Therefore, these numbers must be considered a rough range and not a precise risk of having a Lynch syndrome mutation.  These models estimate that she has approximately a 2.1% chance of having a mutation.    PLAN: After considering the risks, benefits, and limitations, Ms. Goodnow  provided informed consent to pursue  genetic testing and the blood sample was sent to [facility] for analysis of the STAT panel, reflexing to the Multi cancer panel. Results should be available within approximately 2-3  weeks' time, at which point they will be disclosed by telephone to Ms. Mcfayden, as will any additional recommendations warranted by these results. Ms. Klinke will receive a summary of her genetic counseling visit and a copy of her results once available. This information will also be available in Epic. We encouraged Ms. Prowell to remain in contact with cancer genetics annually so that we can continuously update the family history and inform her of any changes in cancer genetics and testing that may be of benefit for her family. Ms. Potenza questions were answered to her satisfaction today. Our contact information was provided should additional questions or concerns arise.  Lastly, we encouraged Ms. Weingart to remain in contact with cancer genetics annually so that we can continuously update the family history and inform her of any changes in cancer genetics and testing that may be of benefit for this family.   Ms.  Eckersley questions were answered to her satisfaction today. Our contact information was provided should additional questions or concerns arise. Thank you for the referral and allowing [place] to share in the care of your patient.   Roston Grunewald P. Florene Glen, [place], [facility] Certified Genetic Counselor [place] [email or web address] phone: [phone]  The patient was seen for a total of 45 minutes in face-to-face genetic counseling.  This patient was discussed with Drs. Magrinat, Monica Abbott and/or Burr Medico who agrees with the above.    _______________________________________________________________________ For Office Staff:  Number of people involved in session: 2 Was an Intern/ student involved with case: no

## 2018-04-19 ENCOUNTER — Other Ambulatory Visit: Payer: Self-pay

## 2018-04-19 ENCOUNTER — Telehealth: Payer: Self-pay | Admitting: Hematology and Oncology

## 2018-04-19 ENCOUNTER — Encounter (HOSPITAL_BASED_OUTPATIENT_CLINIC_OR_DEPARTMENT_OTHER): Payer: Self-pay | Admitting: *Deleted

## 2018-04-19 NOTE — Telephone Encounter (Signed)
Scheduled appt per 8/28 sch message - pt is aware of appt date and time.

## 2018-04-20 ENCOUNTER — Encounter (HOSPITAL_BASED_OUTPATIENT_CLINIC_OR_DEPARTMENT_OTHER)
Admission: RE | Admit: 2018-04-20 | Discharge: 2018-04-20 | Disposition: A | Discharge status: Home / Self Care | Payer: Medicare Other | Source: Ambulatory Visit | Attending: Surgery | Admitting: Surgery

## 2018-04-20 DIAGNOSIS — I10 Essential (primary) hypertension: Secondary | ICD-10-CM | POA: Diagnosis not present

## 2018-04-20 DIAGNOSIS — Z01810 Encounter for preprocedural cardiovascular examination: Secondary | ICD-10-CM | POA: Insufficient documentation

## 2018-04-20 NOTE — Progress Notes (Signed)
Ensure Pre Surgery drink given to patient with instructions to complete by 0415, surgical soap given to patient with instructions for use.  Pt verbalized understanding of instructions.

## 2018-04-25 ENCOUNTER — Ambulatory Visit
Admission: RE | Admit: 2018-04-25 | Discharge: 2018-04-25 | Disposition: A | Discharge status: Home / Self Care | Payer: Medicare Other | Source: Ambulatory Visit | Attending: Surgery | Admitting: Surgery

## 2018-04-25 ENCOUNTER — Encounter: Payer: Self-pay | Admitting: Genetic Counselor

## 2018-04-25 ENCOUNTER — Telehealth: Payer: Self-pay | Admitting: Genetic Counselor

## 2018-04-25 DIAGNOSIS — Z170 Estrogen receptor positive status [ER+]: Principal | ICD-10-CM

## 2018-04-25 DIAGNOSIS — C50912 Malignant neoplasm of unspecified site of left female breast: Secondary | ICD-10-CM

## 2018-04-25 DIAGNOSIS — Z1379 Encounter for other screening for genetic and chromosomal anomalies: Secondary | ICD-10-CM | POA: Insufficient documentation

## 2018-04-25 NOTE — Telephone Encounter (Signed)
Revealed negative genetic testing.  Discussed that we do not know why she has breast cancer or why there is cancer in the family. It could be due to a different gene that we are not testing, or maybe our current technology may not be able to pick something up.  It will be important for her to keep in contact with genetics to keep up with whether additional testing may be needed.  Discussed that there is a VUS in PTCH1.  We consider this a normal result and will not change how we follow her based on this test.

## 2018-04-27 ENCOUNTER — Ambulatory Visit
Admission: RE | Admit: 2018-04-27 | Discharge: 2018-04-27 | Disposition: A | Discharge status: Home / Self Care | Payer: Medicare Other | Source: Ambulatory Visit | Attending: Surgery | Admitting: Surgery

## 2018-04-27 ENCOUNTER — Encounter (HOSPITAL_BASED_OUTPATIENT_CLINIC_OR_DEPARTMENT_OTHER): Payer: Self-pay

## 2018-04-27 ENCOUNTER — Ambulatory Visit (HOSPITAL_BASED_OUTPATIENT_CLINIC_OR_DEPARTMENT_OTHER)
Admission: RE | Admit: 2018-04-27 | Discharge: 2018-04-27 | Disposition: A | Discharge status: Home / Self Care | Payer: Medicare Other | Source: Ambulatory Visit | Attending: Surgery | Admitting: Surgery

## 2018-04-27 ENCOUNTER — Other Ambulatory Visit: Payer: Self-pay

## 2018-04-27 ENCOUNTER — Encounter (HOSPITAL_BASED_OUTPATIENT_CLINIC_OR_DEPARTMENT_OTHER)
Admission: RE | Disposition: A | Discharge status: Home / Self Care | Payer: Self-pay | Source: Ambulatory Visit | Attending: Surgery

## 2018-04-27 ENCOUNTER — Ambulatory Visit (HOSPITAL_BASED_OUTPATIENT_CLINIC_OR_DEPARTMENT_OTHER): Payer: Medicare Other | Admitting: Anesthesiology

## 2018-04-27 ENCOUNTER — Ambulatory Visit (HOSPITAL_COMMUNITY)
Admission: RE | Admit: 2018-04-27 | Discharge: 2018-04-27 | Disposition: A | Discharge status: Home / Self Care | Payer: Medicare Other | Source: Ambulatory Visit | Attending: Surgery | Admitting: Surgery

## 2018-04-27 DIAGNOSIS — C50912 Malignant neoplasm of unspecified site of left female breast: Secondary | ICD-10-CM

## 2018-04-27 DIAGNOSIS — Z170 Estrogen receptor positive status [ER+]: Principal | ICD-10-CM

## 2018-04-27 DIAGNOSIS — Z90711 Acquired absence of uterus with remaining cervical stump: Secondary | ICD-10-CM | POA: Insufficient documentation

## 2018-04-27 DIAGNOSIS — C773 Secondary and unspecified malignant neoplasm of axilla and upper limb lymph nodes: Secondary | ICD-10-CM | POA: Insufficient documentation

## 2018-04-27 DIAGNOSIS — Z791 Long term (current) use of non-steroidal anti-inflammatories (NSAID): Secondary | ICD-10-CM | POA: Insufficient documentation

## 2018-04-27 DIAGNOSIS — I10 Essential (primary) hypertension: Secondary | ICD-10-CM | POA: Diagnosis not present

## 2018-04-27 DIAGNOSIS — Z79899 Other long term (current) drug therapy: Secondary | ICD-10-CM | POA: Insufficient documentation

## 2018-04-27 DIAGNOSIS — Z8041 Family history of malignant neoplasm of ovary: Secondary | ICD-10-CM | POA: Diagnosis not present

## 2018-04-27 DIAGNOSIS — Z803 Family history of malignant neoplasm of breast: Secondary | ICD-10-CM | POA: Diagnosis not present

## 2018-04-27 DIAGNOSIS — C50112 Malignant neoplasm of central portion of left female breast: Secondary | ICD-10-CM | POA: Insufficient documentation

## 2018-04-27 HISTORY — DX: Malignant (primary) neoplasm, unspecified: C80.1

## 2018-04-27 HISTORY — DX: Anemia, unspecified: D64.9

## 2018-04-27 HISTORY — DX: Essential (primary) hypertension: I10

## 2018-04-27 HISTORY — PX: BREAST LUMPECTOMY WITH RADIOACTIVE SEED AND SENTINEL LYMPH NODE BIOPSY: SHX6550

## 2018-04-27 SURGERY — BREAST LUMPECTOMY WITH RADIOACTIVE SEED AND SENTINEL LYMPH NODE BIOPSY
Anesthesia: General | Site: Breast | Laterality: Left

## 2018-04-27 MED ORDER — BUPIVACAINE-EPINEPHRINE (PF) 0.25% -1:200000 IJ SOLN
INTRAMUSCULAR | Status: DC | PRN
  Administered 2018-04-27: 28 mL

## 2018-04-27 MED ORDER — MIDAZOLAM HCL 2 MG/2ML IJ SOLN
INTRAMUSCULAR | Status: AC
  Filled 2018-04-27: qty 2

## 2018-04-27 MED ORDER — BUPIVACAINE-EPINEPHRINE (PF) 0.25% -1:200000 IJ SOLN
INTRAMUSCULAR | Status: AC
  Filled 2018-04-27: qty 90

## 2018-04-27 MED ORDER — LIDOCAINE 2% (20 MG/ML) 5 ML SYRINGE
INTRAMUSCULAR | Status: DC | PRN
  Administered 2018-04-27: 20 mg via INTRAVENOUS

## 2018-04-27 MED ORDER — CELECOXIB 200 MG PO CAPS
ORAL_CAPSULE | ORAL | Status: AC
  Filled 2018-04-27: qty 2

## 2018-04-27 MED ORDER — SCOPOLAMINE 1 MG/3DAYS TD PT72: 1.0000 | MEDICATED_PATCH | Freq: Once | TRANSDERMAL | Status: DC | PRN

## 2018-04-27 MED ORDER — FENTANYL CITRATE (PF) 100 MCG/2ML IJ SOLN
50.0000 ug | INTRAMUSCULAR | Status: AC | PRN
  Administered 2018-04-27 (×3): 50 ug via INTRAVENOUS

## 2018-04-27 MED ORDER — ONDANSETRON HCL 4 MG/2ML IJ SOLN
INTRAMUSCULAR | Status: DC | PRN
  Administered 2018-04-27: 4 mg via INTRAVENOUS

## 2018-04-27 MED ORDER — OXYCODONE HCL 5 MG PO TABS: 5.0000 mg | ORAL_TABLET | Freq: Four times a day (QID) | ORAL | 0 refills | Status: DC | PRN

## 2018-04-27 MED ORDER — LIDOCAINE 2% (20 MG/ML) 5 ML SYRINGE
INTRAMUSCULAR | Status: AC
  Filled 2018-04-27: qty 5

## 2018-04-27 MED ORDER — DEXAMETHASONE SODIUM PHOSPHATE 10 MG/ML IJ SOLN: INTRAMUSCULAR | Status: DC | PRN

## 2018-04-27 MED ORDER — PHENYLEPHRINE 40 MCG/ML (10ML) SYRINGE FOR IV PUSH (FOR BLOOD PRESSURE SUPPORT)
PREFILLED_SYRINGE | INTRAVENOUS | Status: DC | PRN
  Administered 2018-04-27 (×4): 80 ug via INTRAVENOUS

## 2018-04-27 MED ORDER — DEXAMETHASONE SODIUM PHOSPHATE 10 MG/ML IJ SOLN
INTRAMUSCULAR | Status: AC
  Filled 2018-04-27: qty 1

## 2018-04-27 MED ORDER — GABAPENTIN 300 MG PO CAPS
300.0000 mg | ORAL_CAPSULE | ORAL | Status: AC
  Administered 2018-04-27: 300 mg via ORAL

## 2018-04-27 MED ORDER — FENTANYL CITRATE (PF) 100 MCG/2ML IJ SOLN
INTRAMUSCULAR | Status: AC
  Filled 2018-04-27: qty 2

## 2018-04-27 MED ORDER — HYDROMORPHONE HCL 1 MG/ML IJ SOLN: 0.2500 mg | INTRAMUSCULAR | Status: DC | PRN

## 2018-04-27 MED ORDER — CHLORHEXIDINE GLUCONATE CLOTH 2 % EX PADS: 6.0000 | MEDICATED_PAD | Freq: Once | CUTANEOUS | Status: DC

## 2018-04-27 MED ORDER — LACTATED RINGERS IV SOLN
INTRAVENOUS | Status: DC
  Administered 2018-04-27: 07:00:00 via INTRAVENOUS

## 2018-04-27 MED ORDER — PROPOFOL 10 MG/ML IV BOLUS
INTRAVENOUS | Status: DC | PRN
  Administered 2018-04-27: 30 mg via INTRAVENOUS
  Administered 2018-04-27: 150 mg via INTRAVENOUS

## 2018-04-27 MED ORDER — CEFAZOLIN SODIUM-DEXTROSE 2-4 GM/100ML-% IV SOLN
2.0000 g | INTRAVENOUS | Status: AC
  Administered 2018-04-27: 2 g via INTRAVENOUS

## 2018-04-27 MED ORDER — METHYLENE BLUE 0.5 % INJ SOLN
INTRAVENOUS | Status: AC
  Filled 2018-04-27: qty 10

## 2018-04-27 MED ORDER — ACETAMINOPHEN 500 MG PO TABS
1000.0000 mg | ORAL_TABLET | ORAL | Status: AC
  Administered 2018-04-27: 1000 mg via ORAL

## 2018-04-27 MED ORDER — BUPIVACAINE-EPINEPHRINE (PF) 0.5% -1:200000 IJ SOLN
INTRAMUSCULAR | Status: DC | PRN
  Administered 2018-04-27: 30 mL

## 2018-04-27 MED ORDER — PHENYLEPHRINE 40 MCG/ML (10ML) SYRINGE FOR IV PUSH (FOR BLOOD PRESSURE SUPPORT)
PREFILLED_SYRINGE | INTRAVENOUS | Status: AC
  Filled 2018-04-27: qty 10

## 2018-04-27 MED ORDER — GABAPENTIN 300 MG PO CAPS
ORAL_CAPSULE | ORAL | Status: AC
  Filled 2018-04-27: qty 1

## 2018-04-27 MED ORDER — PROPOFOL 10 MG/ML IV BOLUS
INTRAVENOUS | Status: AC
  Filled 2018-04-27: qty 40

## 2018-04-27 MED ORDER — CELECOXIB 400 MG PO CAPS
400.0000 mg | ORAL_CAPSULE | ORAL | Status: AC
  Administered 2018-04-27: 400 mg via ORAL

## 2018-04-27 MED ORDER — ACETAMINOPHEN 500 MG PO TABS
ORAL_TABLET | ORAL | Status: AC
  Filled 2018-04-27: qty 2

## 2018-04-27 MED ORDER — SODIUM CHLORIDE 0.9 % IJ SOLN
INTRAMUSCULAR | Status: AC
  Filled 2018-04-27: qty 10

## 2018-04-27 MED ORDER — DEXAMETHASONE SODIUM PHOSPHATE 10 MG/ML IJ SOLN
INTRAMUSCULAR | Status: DC | PRN
  Administered 2018-04-27: 10 mg via INTRAVENOUS

## 2018-04-27 MED ORDER — TECHNETIUM TC 99M SULFUR COLLOID FILTERED
1.0000 | Freq: Once | INTRAVENOUS | Status: AC | PRN
  Administered 2018-04-27: 1 via INTRADERMAL

## 2018-04-27 MED ORDER — IBUPROFEN 800 MG PO TABS: 800.0000 mg | ORAL_TABLET | Freq: Three times a day (TID) | ORAL | 0 refills | Status: DC | PRN

## 2018-04-27 MED ORDER — ONDANSETRON HCL 4 MG/2ML IJ SOLN
INTRAMUSCULAR | Status: AC
  Filled 2018-04-27: qty 2

## 2018-04-27 MED ORDER — CEFAZOLIN SODIUM-DEXTROSE 2-4 GM/100ML-% IV SOLN
INTRAVENOUS | Status: AC
  Filled 2018-04-27: qty 100

## 2018-04-27 MED ORDER — MIDAZOLAM HCL 2 MG/2ML IJ SOLN
1.0000 mg | INTRAMUSCULAR | Status: DC | PRN
  Administered 2018-04-27: 1 mg via INTRAVENOUS
  Administered 2018-04-27: 2 mg via INTRAVENOUS

## 2018-04-27 SURGICAL SUPPLY — 51 items
APPLIER CLIP 9.375 MED OPEN (MISCELLANEOUS) ×3
BINDER BREAST LRG (GAUZE/BANDAGES/DRESSINGS) IMPLANT
BINDER BREAST MEDIUM (GAUZE/BANDAGES/DRESSINGS) IMPLANT
BINDER BREAST XLRG (GAUZE/BANDAGES/DRESSINGS) IMPLANT
BINDER BREAST XXLRG (GAUZE/BANDAGES/DRESSINGS) ×3 IMPLANT
BLADE SURG 15 STRL LF DISP TIS (BLADE) ×1 IMPLANT
BLADE SURG 15 STRL SS (BLADE) ×2
CANISTER SUC SOCK COL 7IN (MISCELLANEOUS) IMPLANT
CANISTER SUCT 1200ML W/VALVE (MISCELLANEOUS) ×3 IMPLANT
CHLORAPREP W/TINT 26ML (MISCELLANEOUS) ×3 IMPLANT
CLIP APPLIE 9.375 MED OPEN (MISCELLANEOUS) ×1 IMPLANT
COVER BACK TABLE 60X90IN (DRAPES) ×3 IMPLANT
COVER MAYO STAND STRL (DRAPES) ×3 IMPLANT
COVER PROBE W GEL 5X96 (DRAPES) ×3 IMPLANT
DECANTER SPIKE VIAL GLASS SM (MISCELLANEOUS) IMPLANT
DERMABOND ADVANCED (GAUZE/BANDAGES/DRESSINGS) ×2
DERMABOND ADVANCED .7 DNX12 (GAUZE/BANDAGES/DRESSINGS) ×1 IMPLANT
DEVICE DUBIN W/COMP PLATE 8390 (MISCELLANEOUS) ×3 IMPLANT
DRAPE LAPAROSCOPIC ABDOMINAL (DRAPES) ×3 IMPLANT
DRAPE UTILITY XL STRL (DRAPES) ×3 IMPLANT
ELECT COATED BLADE 2.86 ST (ELECTRODE) ×3 IMPLANT
ELECT REM PT RETURN 9FT ADLT (ELECTROSURGICAL) ×3
ELECTRODE REM PT RTRN 9FT ADLT (ELECTROSURGICAL) ×1 IMPLANT
GLOVE BIOGEL PI IND STRL 6.5 (GLOVE) ×1 IMPLANT
GLOVE BIOGEL PI IND STRL 8 (GLOVE) ×1 IMPLANT
GLOVE BIOGEL PI INDICATOR 6.5 (GLOVE) ×2
GLOVE BIOGEL PI INDICATOR 8 (GLOVE) ×2
GLOVE ECLIPSE 8.0 STRL XLNG CF (GLOVE) ×3 IMPLANT
GLOVE EXAM NITRILE MD LF STRL (GLOVE) ×3 IMPLANT
GLOVE SURG SS PI 6.5 STRL IVOR (GLOVE) ×3 IMPLANT
GOWN STRL REUS W/ TWL LRG LVL3 (GOWN DISPOSABLE) ×2 IMPLANT
GOWN STRL REUS W/TWL LRG LVL3 (GOWN DISPOSABLE) ×4
HEMOSTAT ARISTA ABSORB 3G PWDR (MISCELLANEOUS) ×3 IMPLANT
HEMOSTAT SNOW SURGICEL 2X4 (HEMOSTASIS) IMPLANT
KIT MARKER MARGIN INK (KITS) ×3 IMPLANT
NDL SAFETY ECLIPSE 18X1.5 (NEEDLE) IMPLANT
NEEDLE HYPO 18GX1.5 SHARP (NEEDLE)
NEEDLE HYPO 25X1 1.5 SAFETY (NEEDLE) ×3 IMPLANT
NS IRRIG 1000ML POUR BTL (IV SOLUTION) ×3 IMPLANT
PACK BASIN DAY SURGERY FS (CUSTOM PROCEDURE TRAY) ×3 IMPLANT
PENCIL BUTTON HOLSTER BLD 10FT (ELECTRODE) ×3 IMPLANT
SLEEVE SCD COMPRESS KNEE MED (MISCELLANEOUS) ×3 IMPLANT
SPONGE LAP 4X18 RFD (DISPOSABLE) ×3 IMPLANT
SUT MNCRL AB 4-0 PS2 18 (SUTURE) ×3 IMPLANT
SUT VICRYL 3-0 CR8 SH (SUTURE) ×3 IMPLANT
SYR CONTROL 10ML LL (SYRINGE) ×3 IMPLANT
TOWEL GREEN STERILE FF (TOWEL DISPOSABLE) ×3 IMPLANT
TOWEL OR NON WOVEN STRL DISP B (DISPOSABLE) IMPLANT
TUBE CONNECTING 20'X1/4 (TUBING) ×1
TUBE CONNECTING 20X1/4 (TUBING) ×2 IMPLANT
YANKAUER SUCT BULB TIP NO VENT (SUCTIONS) ×3 IMPLANT

## 2018-04-27 NOTE — Discharge Instructions (Signed)
NO TYLENOL BEFORE 12:30 PM Today!    Post Anesthesia Home Care Instructions  Activity: Get plenty of rest for the remainder of the day. A responsible individual must stay with you for 24 hours following the procedure.  For the next 24 hours, DO NOT: -Drive a car -Paediatric nurse -Drink alcoholic beverages -Take any medication unless instructed by your physician -Make any legal decisions or sign important papers.  Meals: Start with liquid foods such as gelatin or soup. Progress to regular foods as tolerated. Avoid greasy, spicy, heavy foods. If nausea and/or vomiting occur, drink only clear liquids until the nausea and/or vomiting subsides. Call your physician if vomiting continues.  Special Instructions/Symptoms: Your throat may feel dry or sore from the anesthesia or the breathing tube placed in your throat during surgery. If this causes discomfort, gargle with warm salt water. The discomfort should disappear within 24 hours.  If you had a scopolamine patch placed behind your ear for the management of post- operative nausea and/or vomiting:  1. The medication in the patch is effective for 72 hours, after which it should be removed.  Wrap patch in a tissue and discard in the trash. Wash hands thoroughly with soap and water. 2. You may remove the patch earlier than 72 hours if you experience unpleasant side effects which may include dry mouth, dizziness or visual disturbances. 3. Avoid touching the patch. Wash your hands with soap and water after contact with the patch.           [place] Office Phone Number [phone]  BREAST BIOPSY/ PARTIAL MASTECTOMY: POST OP INSTRUCTIONS  Always review your discharge instruction sheet given to you by the facility where your surgery was performed.  IF YOU HAVE DISABILITY OR FAMILY LEAVE FORMS, YOU MUST BRING THEM TO THE OFFICE FOR PROCESSING.  DO NOT GIVE THEM TO YOUR DOCTOR.  1. A prescription for pain  medication may be given to you upon discharge.  Take your pain medication as prescribed, if needed.  If narcotic pain medicine is not needed, then you may take acetaminophen (Tylenol) or ibuprofen (Advil) as needed. 2. Take your usually prescribed medications unless otherwise directed 3. If you need a refill on your pain medication, please contact your pharmacy.  They will contact our office to request authorization.  Prescriptions will not be filled after 5pm or on week-ends. 4. You should eat very light the first 24 hours after surgery, such as soup, crackers, pudding, etc.  Resume your normal diet the day after surgery. 5. Most patients will experience some swelling and bruising in the breast.  Ice packs and a good support bra will help.  Swelling and bruising can take several days to resolve.  6. It is common to experience some constipation if taking pain medication after surgery.  Increasing fluid intake and taking a stool softener will usually help or prevent this problem from occurring.  A mild laxative (Milk of Magnesia or Miralax) should be taken according to package directions if there are no bowel movements after 48 hours. 7. Unless discharge instructions indicate otherwise, you may remove your bandages 24-48 hours after surgery, and you may shower at that time.  You may have steri-strips (small skin tapes) in place directly over the incision.  These strips should be left on the skin for 7-10 days.  If your surgeon used skin glue on the incision, you may shower in 24 hours.  The glue will flake off over the next 2-3 weeks.  Any  sutures or staples will be removed at the office during your follow-up visit. 8. ACTIVITIES:  You may resume regular daily activities (gradually increasing) beginning the next day.  Wearing a good support bra or sports bra minimizes pain and swelling.  You may have sexual intercourse when it is comfortable. a. You may drive when you no longer are taking prescription pain  medication, you can comfortably wear a seatbelt, and you can safely maneuver your car and apply brakes. b. RETURN TO WORK:  ______________________________________________________________________________________ 9. You should see your doctor in the office for a follow-up appointment approximately two weeks after your surgery.  Your doctors nurse will typically make your follow-up appointment when she calls you with your pathology report.  Expect your pathology report 2-3 business days after your surgery.  You may call to check if you do not hear from [place] after three days. 10. OTHER INSTRUCTIONS: _______________________________________________________________________________________________ _____________________________________________________________________________________________________________________________________ _____________________________________________________________________________________________________________________________________ _____________________________________________________________________________________________________________________________________  WHEN TO CALL YOUR DOCTOR: 1. Fever over 101.0 2. Nausea and/or vomiting. 3. Extreme swelling or bruising. 4. Continued bleeding from incision. 5. Increased pain, redness, or drainage from the incision.  The clinic staff is available to answer your questions during regular business hours.  Please dont hesitate to call and ask to speak to one of the nurses for clinical concerns.  If you have a medical emergency, go to the nearest emergency room or call 911.  A surgeon from [facility] is always on call at the hospital.  For further questions, please visit [email or web address]         Post Anesthesia Home Care Instructions  Activity: Get plenty of rest for the remainder of the day. A responsible individual must stay with you for 24 hours following the procedure.  For the next 24 hours, DO  NOT: -Drive a car -Paediatric nurse -Drink alcoholic beverages -Take any medication unless instructed by your physician -Make any legal decisions or sign important papers.  Meals: Start with liquid foods such as gelatin or soup. Progress to regular foods as tolerated. Avoid greasy, spicy, heavy foods. If nausea and/or vomiting occur, drink only clear liquids until the nausea and/or vomiting subsides. Call your physician if vomiting continues.  Special Instructions/Symptoms: Your throat may feel dry or sore from the anesthesia or the breathing tube placed in your throat during surgery. If this causes discomfort, gargle with warm salt water. The discomfort should disappear within 24 hours.  If you had a scopolamine patch placed behind your ear for the management of post- operative nausea and/or vomiting:  1. The medication in the patch is effective for 72 hours, after which it should be removed.  Wrap patch in a tissue and discard in the trash. Wash hands thoroughly with soap and water. 2. You may remove the patch earlier than 72 hours if you experience unpleasant side effects which may include dry mouth, dizziness or visual disturbances. 3. Avoid touching the patch. Wash your hands with soap and water after contact with the patch.     Regional Anesthesia Blocks  1. Numbness or the inability to move the "blocked" extremity may last from 3-48 hours after placement. The length of time depends on the medication injected and your individual response to the medication. If the numbness is not going away after 48 hours, call your surgeon.  2. The extremity that is blocked will need to be protected until the numbness is gone and the  Strength has returned. Because you cannot feel it, you will need to take extra care  to avoid injury. Because it may be weak, you may have difficulty moving it or using it. You may not know what position it is in without looking at it while the block is in effect.  3.  For blocks in the legs and feet, returning to weight bearing and walking needs to be done carefully. You will need to wait until the numbness is entirely gone and the strength has returned. You should be able to move your leg and foot normally before you try and bear weight or walk. You will need someone to be with you when you first try to ensure you do not fall and possibly risk injury.  4. Bruising and tenderness at the needle site are common side effects and will resolve in a few days.  5. Persistent numbness or new problems with movement should be communicated to the surgeon or the [facility] [phone]).

## 2018-04-27 NOTE — Interval H&P Note (Signed)
History and Physical Interval Note:  04/27/2018 7:18 AM  Monica Abbott  has presented today for surgery, with the diagnosis of LEFT BREAST CANCER  The various methods of treatment have been discussed with the patient and family. After consideration of risks, benefits and other options for treatment, the patient has consented to  Procedure(s): BREAST LUMPECTOMY WITH RADIOACTIVE SEED AND SENTINEL LYMPH NODE BIOPSY (Left) as a surgical intervention .  The patient's history has been reviewed, patient examined, no change in status, stable for surgery.  I have reviewed the patient's chart and labs.  Questions were answered to the patient's satisfaction.     [place]

## 2018-04-27 NOTE — Anesthesia Procedure Notes (Signed)
Procedure Name: LMA Insertion Date/Time: 04/27/2018 7:38 AM Performed by: Bonney Aid, CRNA Pre-anesthesia Checklist: Patient identified, Emergency Drugs available, Suction available and Patient being monitored Patient Re-evaluated:Patient Re-evaluated prior to induction Oxygen Delivery Method: Circle system utilized Preoxygenation: Pre-oxygenation with 100% oxygen Induction Type: IV induction Ventilation: Mask ventilation without difficulty LMA: LMA inserted LMA Size: 4.0 Number of attempts: 1 Airway Equipment and Method: Bite block Placement Confirmation: positive ETCO2 Tube secured with: Tape Dental Injury: Teeth and Oropharynx as per pre-operative assessment

## 2018-04-27 NOTE — Transfer of Care (Signed)
Immediate Anesthesia Transfer of Care Note  Patient: Monica Abbott  Procedure(s) Performed: LEFT BREAST LUMPECTOMY WITH RADIOACTIVE SEED AND SENTINEL LYMPH NODE BIOPSY (Left Breast)  Patient Location: PACU  Anesthesia Type:General  Level of Consciousness: drowsy  Airway & Oxygen Therapy: Patient Spontanous Breathing and Patient connected to face mask oxygen  Post-op Assessment: Report given to RN  Post vital signs: Reviewed and stable  Last Vitals: 132/62, 13 Vitals Value Taken Time  BP    Temp    Pulse 69 04/27/2018  9:01 AM  Resp    SpO2 99 % 04/27/2018  9:01 AM  Vitals shown include unvalidated device data.  Last Pain:  Vitals:   04/27/18 0645  TempSrc: Oral  PainSc: 0-No pain         Complications: none

## 2018-04-27 NOTE — Progress Notes (Signed)
Assisted Dr. Oren Bracket with left, ultrasound guided, pectoralis block. Side rails up, monitors on throughout procedure. See vital signs in flow sheet. Tolerated Procedure well.

## 2018-04-27 NOTE — Anesthesia Postprocedure Evaluation (Signed)
Anesthesia Post Note  Patient: Madasyn Heath  Procedure(s) Performed: LEFT BREAST LUMPECTOMY WITH RADIOACTIVE SEED AND SENTINEL LYMPH NODE BIOPSY (Left Breast)     Patient location during evaluation: PACU Anesthesia Type: General and Regional Level of consciousness: awake and alert Pain management: pain level controlled Vital Signs Assessment: post-procedure vital signs reviewed and stable Respiratory status: spontaneous breathing, nonlabored ventilation and respiratory function stable Cardiovascular status: blood pressure returned to baseline and stable Postop Assessment: no apparent nausea or vomiting Anesthetic complications: no    Last Vitals:  Vitals:   04/27/18 0930 04/27/18 0935  BP: 118/65   Pulse: 60 (!) 51  Resp: 17 17  Temp:    SpO2: 97% 98%    Last Pain:  Vitals:   04/27/18 0935  TempSrc:   PainSc: 0-No pain                 Karman Biswell,W. EDMOND

## 2018-04-27 NOTE — Anesthesia Preprocedure Evaluation (Addendum)
Anesthesia Evaluation  Patient identified by MRN, date of birth, ID band Patient awake    Reviewed: Allergy & Precautions, H&P , NPO status , Patient's Chart, lab work & pertinent test results  Airway Mallampati: II  TM Distance: >3 FB Neck ROM: Full    Dental no notable dental hx. (+) Upper Dentures, Lower Dentures, Dental Advisory Given   Pulmonary neg pulmonary ROS,    Pulmonary exam normal breath sounds clear to auscultation       Cardiovascular hypertension, Pt. on medications  Rhythm:Regular Rate:Normal     Neuro/Psych negative neurological ROS  negative psych ROS   GI/Hepatic negative GI ROS, Neg liver ROS,   Endo/Other  negative endocrine ROS  Renal/GU negative Renal ROS  negative genitourinary   Musculoskeletal   Abdominal   Peds  Hematology negative hematology ROS (+) anemia ,   Anesthesia Other Findings   Reproductive/Obstetrics negative OB ROS                            Anesthesia Physical Anesthesia Plan  ASA: II  Anesthesia Plan: General   Post-op Pain Management:  Regional for Post-op pain   Induction: Intravenous  PONV Risk Score and Plan: 4 or greater and Ondansetron, Dexamethasone and Midazolam  Airway Management Planned: LMA  Additional Equipment:   Intra-op Plan:   Post-operative Plan: Extubation in OR  Informed Consent: I have reviewed the patients History and Physical, chart, labs and discussed the procedure including the risks, benefits and alternatives for the proposed anesthesia with the patient or authorized representative who has indicated his/her understanding and acceptance.   Dental advisory given  Plan Discussed with: CRNA  Anesthesia Plan Comments:         Anesthesia Quick Evaluation

## 2018-04-27 NOTE — Anesthesia Procedure Notes (Signed)
Anesthesia Regional Block: Pectoralis block   Pre-Anesthetic Checklist: ,, timeout performed, Correct Patient, Correct Site, Correct Laterality, Correct Procedure, Correct Position, site marked, Risks and benefits discussed, pre-op evaluation,  At surgeon's request and post-op pain management  Laterality: Left  Prep: Maximum Sterile Barrier Precautions used, chloraprep       Needles:  Injection technique: Single-shot  Needle Type: Echogenic Stimulator Needle     Needle Length: 9cm  Needle Gauge: 21     Additional Needles:   Procedures:,,,, ultrasound used (permanent image in chart),,,,  Narrative:  Start time: 04/27/2018 6:59 AM End time: 04/27/2018 7:09 AM Injection made incrementally with aspirations every 5 mL. Anesthesiologist: Roderic Palau, MD  Additional Notes: 2% Lidocaine skin wheel.

## 2018-04-27 NOTE — Op Note (Signed)
Preoperative diagnosis: Stage I left breast cancer central  Postoperative diagnosis: Same  Procedure: Left breast seed localized lumpectomy with left axillary sentinel lymph node mapping of deep left axillary sentinel nodes  Surgeon: Erroll Luna, MD  Anesthesia: General with pectoral block and local  EBL: 20 cc  Drains: None  Specimens: Left breast tissue with seed and clip verified by Faxitron and to left axillary sentinel nodes hot  IV fluids: Per anesthesia record  Indications for procedure: The patient presents for left breast lumpectomy and sentinel lymph node mapping for stage I left breast cancer.  She was seen preoperatively and surgical options were discussed to include mastectomy versus breast conserving surgery the role of sentinel lymph node mapping the role of reconstruction if desired and other adjuvant therapies to include chemotherapy and radiation therapy.  The pros and cons of all approaches were reviewed as well as potential complications of the above.The procedure has been discussed with the patient. Alternatives to surgery have been discussed with the patient.  Risks of surgery include bleeding,  Infection,  Seroma formation, death,  and the need for further surgery.   The patient understands and wishes to proceed.  Description of procedure: The patient was met in the holding area.  The neoprobe was used to localize the seed in her left breast and this was marked as the correct side.  She underwent nuclear medicine injection for mapping procedures by radiology.  All questions were answered.  A pectoral block was placed by anesthesia.  She was taken back to the operating room.  She was placed supine upon the operating room table.  After induction of general anesthesia the left breast was prepped and draped in sterile fashion and timeout was done.  The seed was localized just under the left nipple.  Curvilinear incision was made along the lateral border of the nipple areolar  complex.  Dissection was carried down under the nipple to excise all tissue around the seed and clip with a grossly negative margin.  An additional biopsy of the undersurface of the nipple was taken as well.  This was sent to pathology after verification of seed and clip by Faxitron.  The wound was made hemostatic.  It was closed with 3-0 Vicryl and 4-0 Monocryl local anesthetic was infiltrated as well.  The neoprobe settings were changed to technetium.  The left axilla was examined.  Hot spot identified.  Local anesthetic infiltrated in the left axilla.  A 4 cm incision was made along the inferior border of the hairline of the left axilla.  Dissection was carried down into the level 1 deep contents.  There are 2 hot nodes identified with the neoprobe and removed.  Background counts approaches 0.  Hemostasis achieved.  Arista applied for additional hemostasis.  The wound was closed with 3-0 Vicryl and 4-0 Monocryl.  All final counts found to be correct sponge, needle and instruments.  Dermabond applied.  Breast binder placed.  The patient was awoke extubated taken to recovery in satisfactory condition.

## 2018-04-27 NOTE — Progress Notes (Signed)
Assisted nuc med tech with nuc med inj     Side rails up, monitors on throughout procedure. See vital signs in flow sheet. Tolerated Procedure well. 

## 2018-04-28 ENCOUNTER — Encounter (HOSPITAL_BASED_OUTPATIENT_CLINIC_OR_DEPARTMENT_OTHER): Payer: Self-pay | Admitting: Surgery

## 2018-04-28 ENCOUNTER — Encounter: Payer: Self-pay | Admitting: Genetic Counselor

## 2018-04-28 ENCOUNTER — Ambulatory Visit: Payer: Self-pay | Admitting: Genetic Counselor

## 2018-04-28 DIAGNOSIS — Z1379 Encounter for other screening for genetic and chromosomal anomalies: Secondary | ICD-10-CM

## 2018-04-28 DIAGNOSIS — Z170 Estrogen receptor positive status [ER+]: Secondary | ICD-10-CM

## 2018-04-28 DIAGNOSIS — C50412 Malignant neoplasm of upper-outer quadrant of left female breast: Secondary | ICD-10-CM

## 2018-04-28 DIAGNOSIS — Z8041 Family history of malignant neoplasm of ovary: Secondary | ICD-10-CM

## 2018-04-28 DIAGNOSIS — Z800 Family history of malignant neoplasm of digestive organs: Secondary | ICD-10-CM

## 2018-04-28 DIAGNOSIS — Z803 Family history of malignant neoplasm of breast: Secondary | ICD-10-CM

## 2018-04-28 NOTE — Progress Notes (Signed)
HPI:  Ms. Schwimmer was previously seen in the [facility] due to a personal and family history of cancer and concerns regarding a hereditary predisposition to cancer. Please refer to our prior cancer genetics clinic note for more information regarding Ms. Whitacre's medical, social and family histories, and our assessment and recommendations, at the time. Ms. Gaertner recent genetic test results were disclosed to her, as were recommendations warranted by these results. These results and recommendations are discussed in more detail below.  CANCER HISTORY:    Malignant neoplasm of upper-outer quadrant of left breast in female, estrogen receptor positive ([place])   03/31/2018 Initial Diagnosis    Screening mammogram detected retroareolar solid and cystic mass at 1 o'clock position measuring 0.7 cm, adjacent smaller mass 0.4 cm not biopsied.  Biopsy of the retroareolar mass is grade 1 invasive ductal carcinoma ER 100%, PR 100%, Ki-67 10%, HER-2 negative, T1b N0 stage Ia AJCC 8    04/24/2018 Genetic Testing    PTCH1 c.324G>A VUS identified on the multi-cancer panel.  The Multi-Gene Panel offered by [facility] includes sequencing and/or deletion duplication testing of the following 84 genes: AIP, ALK, APC, ATM, AXIN2,BAP1,  BARD1, BLM, BMPR1A, BRCA1, BRCA2, BRIP1, CASR, CDC73, CDH1, CDK4, CDKN1B, CDKN1C, CDKN2A (p14ARF), CDKN2A (p16INK4a), CEBPA, CHEK2, CTNNA1, DICER1, DIS3L2, EGFR (c.2369C>T, p.Thr790Met variant only), EPCAM (Deletion/duplication testing only), FH, FLCN, GATA2, GPC3, GREM1 (Promoter region deletion/duplication testing only), HOXB13 (c.251G>A, p.Gly84Glu), HRAS, KIT, MAX, MEN1, MET, MITF (c.952G>A, p.Glu318Lys variant only), MLH1, MSH2, MSH3, MSH6, MUTYH, NBN, NF1, NF2, NTHL1, PALB2, PDGFRA, PHOX2B, PMS2, POLD1, POLE, POT1, PRKAR1A, PTCH1, PTEN, RAD50, RAD51C, RAD51D, RB1, RECQL4, RET, RUNX1, SDHAF2, SDHA (sequence changes only), SDHB, SDHC, SDHD, SMAD4, SMARCA4, SMARCB1, SMARCE1, STK11,  SUFU, TERC, TERT, TMEM127, TP53, TSC1, TSC2, VHL, WRN and WT1.  The report date is April 24, 2018.     FAMILY HISTORY:  We obtained a detailed, 4-generation family history.  Significant diagnoses are listed below: Family History  Problem Relation Age of Onset  . Ovarian cancer Mother   . Colon cancer Father   . Pancreatic cancer Sister 19  . Bladder Cancer Brother 29       smoker  . Kidney cancer Brother 35  . Colon cancer Maternal Aunt        dx in her 66s  . Cancer Maternal Uncle        NOS  . Breast cancer Paternal Aunt   . Lung cancer Paternal Uncle        non-smoker  . Diabetes Maternal Grandmother   . Stroke Maternal Grandmother   . Colon cancer Maternal Grandfather   . Cancer Paternal Grandfather        NOS  . Skin cancer Sister 74  . Breast cancer Maternal Aunt        dx under 82s    The patient has three children, two sons and a daughter.  Her daughter was adopted.  Both sons are cancer free.  She has three sisters and four brothers.  One brother had bladder cancer, one brother has kidney cancer, a sister had pancreatic cancer and another sister had skin cancer.  Both parents are deceased.  The patient's mother had ovarian cancer in her 75's.  She was one of 13 children.  The patient's mother was one of the youngest children, so there is not a lot of information about the cancer in the family, but she states that one of her sisters had colon cancer in her 9's and  died and another sister had breast cancer under 56.  One uncle had an unknown form of cancer.  The maternal grandparents are deceased.  The grandfather died of colon cancer and the grandmother had a stroke.  The patient's father had breast cancer, colon cancer and lung cancer.  He died at 70.  He was one of 8 children.  He had one sister who had breast cancer and a sister with lung cancer.  There may have been others with cancer.  The patient's paternal grandfather had an unknown cancer, and the grandmother  died in her 9's.  Ms. Bazar is unaware of previous family history of genetic testing for hereditary cancer risks. Patient's maternal ancestors are of [place] descent, and paternal ancestors are of [place] descent. There is no reported Ashkenazi Jewish ancestry. There is no known consanguinity.  GENETIC TEST RESULTS: Genetic testing reported out on April 24, 2018 through the [facility] and Multi-cancer panel found no deleterious mutations.  The Multi-Gene Panel offered by [facility] includes sequencing and/or deletion duplication testing of the following 84 genes: AIP, ALK, APC, ATM, AXIN2,BAP1,  BARD1, BLM, BMPR1A, BRCA1, BRCA2, BRIP1, CASR, CDC73, CDH1, CDK4, CDKN1B, CDKN1C, CDKN2A (p14ARF), CDKN2A (p16INK4a), CEBPA, CHEK2, CTNNA1, DICER1, DIS3L2, EGFR (c.2369C>T, p.Thr790Met variant only), EPCAM (Deletion/duplication testing only), FH, FLCN, GATA2, GPC3, GREM1 (Promoter region deletion/duplication testing only), HOXB13 (c.251G>A, p.Gly84Glu), HRAS, KIT, MAX, MEN1, MET, MITF (c.952G>A, p.Glu318Lys variant only), MLH1, MSH2, MSH3, MSH6, MUTYH, NBN, NF1, NF2, NTHL1, PALB2, PDGFRA, PHOX2B, PMS2, POLD1, POLE, POT1, PRKAR1A, PTCH1, PTEN, RAD50, RAD51C, RAD51D, RB1, RECQL4, RET, RUNX1, SDHAF2, SDHA (sequence changes only), SDHB, SDHC, SDHD, SMAD4, SMARCA4, SMARCB1, SMARCE1, STK11, SUFU, TERC, TERT, TMEM127, TP53, TSC1, TSC2, VHL, WRN and WT1.  The test report has been scanned into EPIC and is located under the Molecular Pathology section of the Results Review tab.    We discussed with Ms. Auten that since the current genetic testing is not perfect, it is possible there may be a gene mutation in one of these genes that current testing cannot detect, but that chance is small.  We also discussed, that it is possible that another gene that has not yet been discovered, or that we have not yet tested, is responsible for the cancer diagnoses in the family, and it is, therefore, important to remain in touch with cancer  genetics in the future so that we can continue to offer Ms. Peace the most up to date genetic testing.   Genetic testing did detect a Variant of Unknown Significance in the PTCH1 gene called c.324A>G (p.Ile108Met). At this time, it is unknown if this variant is associated with increased cancer risk or if this is a normal finding, but most variants such as this get reclassified to being inconsequential. It should not be used to make medical management decisions. With time, we suspect the lab will determine the significance of this variant, if any. If we do learn more about it, we will try to contact Ms. Mcguirk to discuss it further. However, it is important to stay in touch with [place] periodically and keep the address and phone number up to date.    CANCER SCREENING RECOMMENDATIONS:  This result is reassuring and indicates that Ms. Tesch likely does not have an increased risk for a future cancer due to a mutation in one of these genes. This normal test also suggests that Ms. Orozco's cancer was most likely not due to an inherited predisposition associated with one of these genes.  Most cancers happen by  chance and this negative test suggests that her cancer falls into this category.  We, therefore, recommended she continue to follow the cancer management and screening guidelines provided by her oncology and primary healthcare provider.   An individual's cancer risk and medical management are not determined by genetic test results alone. Overall cancer risk assessment incorporates additional factors, including personal medical history, family history, and any available genetic information that may result in a personalized plan for cancer prevention and surveillance.  RECOMMENDATIONS FOR FAMILY MEMBERS:  Women in this family might be at some increased risk of developing cancer, over the general population risk, simply due to the family history of cancer.  We recommended women in this family have a yearly  mammogram beginning at age 64, or 14 years younger than the earliest onset of cancer, an annual clinical breast exam, and perform monthly breast self-exams. Women in this family should also have a gynecological exam as recommended by their primary provider. All family members should have a colonoscopy by age 57.  Based on Ms. Wachter's family history, we recommended her sister, Pamala Hurry, who was diagnosed with pancreatic cancer at age 33, have genetic counseling and testing. Ms. Shanks will let us know if we can be of any assistance in coordinating genetic counseling and/or testing for this family member.   FOLLOW-UP: Lastly, we discussed with Ms. Blaker that cancer genetics is a rapidly advancing field and it is possible that new genetic tests will be appropriate for her and/or her family members in the future. We encouraged her to remain in contact with cancer genetics on an annual basis so we can update her personal and family histories and let her know of advances in cancer genetics that may benefit this family.   Our contact number was provided. Ms. Doi questions were answered to her satisfaction, and she knows she is welcome to call us at anytime with additional questions or concerns.   Roma Kayser, MS, [facility] Certified Genetic Counselor [email or web address]

## 2018-04-28 NOTE — Addendum Note (Signed)
Addendum  created 04/28/18 0926 by Tawni Millers, CRNA   Charge Capture section accepted

## 2018-05-04 ENCOUNTER — Inpatient Hospital Stay: Payer: Medicare Other | Attending: Hematology and Oncology | Admitting: Hematology and Oncology

## 2018-05-04 ENCOUNTER — Telehealth: Payer: Self-pay | Admitting: *Deleted

## 2018-05-04 DIAGNOSIS — C50412 Malignant neoplasm of upper-outer quadrant of left female breast: Secondary | ICD-10-CM | POA: Diagnosis not present

## 2018-05-04 DIAGNOSIS — Z170 Estrogen receptor positive status [ER+]: Secondary | ICD-10-CM | POA: Insufficient documentation

## 2018-05-04 NOTE — Telephone Encounter (Signed)
Ordered mammaprint per Dr. Lindi Adie. Faxed requisition to pathology and [facility]end[facility].

## 2018-05-04 NOTE — Progress Notes (Signed)
Patient Care Team: Claudine Mouton, FNP as PCP - General (Family Medicine) Erroll Luna, MD as Consulting Physician (General Surgery) Nicholas Lose, MD as Consulting Physician (Hematology and Oncology) Kyung Rudd, MD as Consulting Physician (Radiation Oncology)  DIAGNOSIS:  Encounter Diagnosis  Name Primary?  . Malignant neoplasm of upper-outer quadrant of left breast in female, estrogen receptor positive ([place])     SUMMARY OF ONCOLOGIC HISTORY:   Malignant neoplasm of upper-outer quadrant of left breast in female, estrogen receptor positive ([place])   03/31/2018 Initial Diagnosis    Screening mammogram detected retroareolar solid and cystic mass at 1 o'clock position measuring 0.7 cm, adjacent smaller mass 0.4 cm not biopsied.  Biopsy of the retroareolar mass is grade 1 invasive ductal carcinoma ER 100%, PR 100%, Ki-67 10%, HER-2 negative, T1b N0 stage Ia AJCC 8    04/24/2018 Genetic Testing    PTCH1 c.324G>A VUS identified on the multi-cancer panel.  The Multi-Gene Panel offered by [facility] includes sequencing and/or deletion duplication testing of the following 84 genes: AIP, ALK, APC, ATM, AXIN2,BAP1,  BARD1, BLM, BMPR1A, BRCA1, BRCA2, BRIP1, CASR, CDC73, CDH1, CDK4, CDKN1B, CDKN1C, CDKN2A (p14ARF), CDKN2A (p16INK4a), CEBPA, CHEK2, CTNNA1, DICER1, DIS3L2, EGFR (c.2369C>T, p.Thr790Met variant only), EPCAM (Deletion/duplication testing only), FH, FLCN, GATA2, GPC3, GREM1 (Promoter region deletion/duplication testing only), HOXB13 (c.251G>A, p.Gly84Glu), HRAS, KIT, MAX, MEN1, MET, MITF (c.952G>A, p.Glu318Lys variant only), MLH1, MSH2, MSH3, MSH6, MUTYH, NBN, NF1, NF2, NTHL1, PALB2, PDGFRA, PHOX2B, PMS2, POLD1, POLE, POT1, PRKAR1A, PTCH1, PTEN, RAD50, RAD51C, RAD51D, RB1, RECQL4, RET, RUNX1, SDHAF2, SDHA (sequence changes only), SDHB, SDHC, SDHD, SMAD4, SMARCA4, SMARCB1, SMARCE1, STK11, SUFU, TERC, TERT, TMEM127, TP53, TSC1, TSC2, VHL, WRN and WT1.  The report date is April 24, 2018.    04/27/2018 Surgery    Left lumpectomy: IDC grade 1, 1.5 cm, DCIS intermediate grade, negative for lymphovascular or perineural invasion, 1/3 lymph nodes positive with extranodal extension, ER 100%, PR 100%, HER-2 negative, Ki-67 10%, T1cN1a stage Ib     CHIEF COMPLIANT: Follow-up after recent left lumpectomy  INTERVAL HISTORY: Roiza Wiedel is a 67 year old with above-mentioned his left breast cancer underwent lumpectomy and is here today to discuss the pathology report.  She is recovering very well from recent surgery.  She still has slight discomfort in the axilla  REVIEW OF SYSTEMS:   Constitutional: Denies fevers, chills or abnormal weight loss Eyes: Denies blurriness of vision Ears, nose, mouth, throat, and face: Denies mucositis or sore throat Respiratory: Denies cough, dyspnea or wheezes Cardiovascular: Denies palpitation, chest discomfort Gastrointestinal:  Denies nausea, heartburn or change in bowel habits Skin: Denies abnormal skin rashes Lymphatics: Denies new lymphadenopathy or easy bruising Neurological:Denies numbness, tingling or new weaknesses Behavioral/Psych: Mood is stable, no new changes  Extremities: No lower extremity edema Breast: Recent left lumpectomy All other systems were reviewed with the patient and are negative.  I have reviewed the past medical history, past surgical history, social history and family history with the patient and they are unchanged from previous note.  ALLERGIES:  has No Known Allergies.  MEDICATIONS:  Current Outpatient Medications  Medication Sig Dispense Refill  . atorvastatin (LIPITOR) 10 MG tablet Take 10 mg by mouth daily.    . Cholecalciferol (VITAMIN D PO) Take 25 mcg by mouth daily.    Marland Kitchen ibuprofen (ADVIL,MOTRIN) 800 MG tablet Take 1 tablet (800 mg total) by mouth every 8 (eight) hours as needed. 30 tablet 0  . lisinopril (PRINIVIL,ZESTRIL) 10 MG tablet Take 10 mg by mouth daily.    Marland Kitchen  meloxicam (MOBIC) 15 MG tablet Take 15 mg  by mouth daily.    . Multiple Vitamin (MULTIVITAMIN WITH MINERALS) TABS tablet Take 1 tablet by mouth daily.    . Omega-3 Fatty Acids (FISH OIL) 1000 MG CPDR Take 1,000 mg by mouth daily.    Marland Kitchen oxybutynin (DITROPAN) 5 MG tablet Take 5 mg by mouth 2 (two) times daily.    Marland Kitchen oxyCODONE (OXY IR/ROXICODONE) 5 MG immediate release tablet Take 1 tablet (5 mg total) by mouth every 6 (six) hours as needed for severe pain. 10 tablet 0   No current facility-administered medications for this visit.     PHYSICAL EXAMINATION: ECOG PERFORMANCE STATUS: 1 - Symptomatic but completely ambulatory  Vitals:   05/04/18 1343  BP: 131/66  Pulse: 64  Resp: 18  Temp: 98.3 F (36.8 C)  SpO2: 99%   Filed Weights   05/04/18 1343  Weight: 187 lb 8 oz (85 kg)    GENERAL:alert, no distress and comfortable SKIN: skin color, texture, turgor are normal, no rashes or significant lesions EYES: normal, Conjunctiva are pink and non-injected, sclera clear OROPHARYNX:no exudate, no erythema and lips, buccal mucosa, and tongue normal  NECK: supple, thyroid normal size, non-tender, without nodularity LYMPH:  no palpable lymphadenopathy in the cervical, axillary or inguinal LUNGS: clear to auscultation and percussion with normal breathing effort HEART: regular rate & rhythm and no murmurs and no lower extremity edema ABDOMEN:abdomen soft, non-tender and normal bowel sounds MUSCULOSKELETAL:no cyanosis of digits and no clubbing  NEURO: alert & oriented x 3 with fluent speech, no focal motor/sensory deficits EXTREMITIES: No lower extremity edema   LABORATORY DATA:  I have reviewed the data as listed CMP Latest Ref Rng & Units 04/12/2018  Glucose 70 - 99 mg/dL 106(H)  BUN 8 - 23 mg/dL 17  Creatinine 0.44 - 1.00 mg/dL 0.99  Sodium 135 - 145 mmol/L 142  Potassium 3.5 - 5.1 mmol/L 4.5  Chloride 98 - 111 mmol/L 107  CO2 22 - 32 mmol/L 27  Calcium 8.9 - 10.3 mg/dL 9.8  Total Protein 6.5 - 8.1 g/dL 7.4  Total Bilirubin  0.3 - 1.2 mg/dL 0.4  Alkaline Phos 38 - 126 U/L 76  AST 15 - 41 U/L 20  ALT 0 - 44 U/L 20    Lab Results  Component Value Date   WBC 4.6 04/12/2018   HGB 13.9 04/12/2018   HCT 40.5 04/12/2018   MCV 86.4 04/12/2018   PLT 210 04/12/2018   NEUTROABS 2.7 04/12/2018    ASSESSMENT & PLAN:  Malignant neoplasm of upper-outer quadrant of left breast in female, estrogen receptor positive ([facility]) Left lumpectomy: IDC grade 1, 1.5 cm, DCIS intermediate grade, negative for lymphovascular or perineural invasion, 1/3 lymph nodes positive with extranodal extension, ER 100%, PR 100%, HER-2 negative, Ki-67 10%, T1c and 1 a stage Ib  Pathology counseling: I discussed the final pathology report of the patient provided  a copy of this report. I discussed the margins as well as lymph node surgeries. We also discussed the final staging along with previously performed ER/PR and HER-2/neu testing.  Recommendation: 1.  Mammaprint testing to determine if she would benefit from chemotherapy 2. followed by adjuvant radiation 3.  Followed by adjuvant antiestrogen therapy  Mammaprint counseling: MINDACT is a prospective, randomized phase III controlled trial that investigates the clinical utility of MammaPrint, when compared to standard clinical pathological criteria, with 6,693 patients enrolled from over 111 institutions. Clinical high-risk patients with a Low Risk MammaPrint result,  including 48% node-positive, had 5-year distant metastasis-free survival rate in excess of 94 percent, whether randomized to receive adjuvant chemotherapy or not proving MammaPrint's ability to safely identify Low Risk patients.  Return to clinic based upon Mammaprint test result    No orders of the defined types were placed in this encounter.  The patient has a good understanding of the overall plan. she agrees with it. she will call with any problems that may develop before the next visit here.   Harriette Ohara,  MD 05/04/18

## 2018-05-04 NOTE — Assessment & Plan Note (Signed)
Left lumpectomy: IDC grade 1, 1.5 cm, DCIS intermediate grade, negative for lymphovascular or perineural invasion, 1/3 lymph nodes positive with extranodal extension, ER 100%, PR 100%, HER-2 negative, Ki-67 10%, T1c and 1 a stage Ib  Pathology counseling: I discussed the final pathology report of the patient provided  a copy of this report. I discussed the margins as well as lymph node surgeries. We also discussed the final staging along with previously performed ER/PR and HER-2/neu testing.  Recommendation: 1.  Mammaprint testing to determine if she would benefit from chemotherapy 2. followed by adjuvant radiation 3.  Followed by adjuvant antiestrogen therapy  Mammaprint counseling: MINDACT is a prospective, randomized phase III controlled trial that investigates the clinical utility of MammaPrint, when compared to standard clinical pathological criteria, with 6,693 patients enrolled from over 111 institutions. Clinical high-risk patients with a Low Risk MammaPrint result, including 48% node-positive, had 5-year distant metastasis-free survival rate in excess of 94 percent, whether randomized to receive adjuvant chemotherapy or not proving MammaPrint's ability to safely identify Low Risk patients.  Return to clinic based upon Mammaprint test result

## 2018-05-15 ENCOUNTER — Encounter: Payer: Self-pay | Admitting: Radiation Oncology

## 2018-05-15 ENCOUNTER — Telehealth: Payer: Self-pay | Admitting: *Deleted

## 2018-05-15 ENCOUNTER — Encounter (HOSPITAL_COMMUNITY): Payer: Self-pay | Admitting: Hematology and Oncology

## 2018-05-15 DIAGNOSIS — C50412 Malignant neoplasm of upper-outer quadrant of left female breast: Secondary | ICD-10-CM

## 2018-05-15 DIAGNOSIS — Z170 Estrogen receptor positive status [ER+]: Principal | ICD-10-CM

## 2018-05-15 NOTE — Telephone Encounter (Signed)
Received mammaprint results of low risk.  Patient aware.  Referral place for Dr.Moody.

## 2018-05-26 NOTE — Progress Notes (Signed)
Location of Breast Cancer:upper-outer quadrant of left breast in female  Histology per Pathology Report:  FINAL DIAGNOSIS Diagnosis 03-31-18 Breast, left, needle core biopsy, 1 o'clock retroareolar - INVASIVE DUCTAL CARCINOMA, GRADE 1-2. SEE NOTE. 1 of  Receptor Status: ER(100 % +), PR (100 % +), Her2-neu (-), Ki-(10 %)  Did patient present with symptoms (if so, please note symptoms) or was this found on screening mammography?: Screening detected mass on mammography  Past/Anticipated interventions by surgeon, if any: FINAL DIAGNOSIS Diagnosis 04-27-18 Dr. Marcello Moores [place] 1. Breast, lumpectomy, Left - INVASIVE DUCTAL CARCINOMA, GRADE 1, 1.5 CM. - DUCTAL CARCINOMA IN SITU, INTERMEDIATE NUCLEAR GRADE. - NEGATIVE FOR LYMPHOVASCULAR OR PERINEURAL INVASION. - BIOPSY SITE CHANGES. - SEE ONCOLOGY TABLE. - SEE NOTE. 2. Nipple Biopsy, left anterior margin/nipple - BENIGN BREAST TISSUE, NEGATIVE FOR CARCINOMA. 3. Lymph node, sentinel, biopsy, Left axillary - METASTATIC CARCINOMA TO A LYMPH NODE (1/1). - FOCUS OF METASTATIC CARCINOMA MEASURES 1.1 CM AND SHOWS EXTRANODAL EXTENSION. 4. Lymph node, sentinel, biopsy, Left axillary - LYMPH NODE, NEGATIVE FOR CARCINOMA (0/1). 5. Lymph node, sentinel, biopsy, Left axillary - LYMPH NODE, NEGATIVE FOR CARCINOMA (0/1). Microscopic Comment 1. INVASIVE CARCINOMA OF THE BREAST: Resection Procedure: Breast lumpectomy. Specimen Laterality: Left. Receptor Status: ER(100 % +), PR (100% +), Her2-neu (-), Ki-(10 %)  Past/Anticipated interventions by medical oncology, if any:   Chemotherapy No  09-05--19 Mammaprint Low Risk 09-02-9 Genetic Testing Result Negative Followed by adjuvant radiation Followed by adjuvant antiestrogen therapyFINAL DIAGNOSIS Lymphedema issues, if any: No ROM to left arm good. Had a PT evaluation doing home exercises. Skin to left breast healing without signs of infection skin intact with old bruising at incision line on left  breast and left axilla. Follow up appointment to see Dr. Erroll Luna 05-11-18.   Pain issues, if any: No   SAFETY ISSUES:No  Prior radiation? :No  Pacemaker/ICD? No  Possible current pregnancy?:No  Is the patient on methotrexate?:No  Current Complaints / other detailsOvarian cancer mother,  Colon cancer father, pancreatic cancer sister, bladder cancer brother and kidney cancer brother  Wt Readings from Last 3 Encounters:  05/30/18 181 lb (82.1 kg)  05/04/18 187 lb 8 oz (85 kg)  04/27/18 187 lb 13.3 oz (85.2 kg)  BP (!) 161/70 (BP Location: Right Arm, Patient Position: Sitting)   Pulse 64   Temp 98.1 F (36.7 C) (Oral)   Resp 18   Ht _0  (1.6 m)   Wt 181 lb (82.1 kg)   SpO2 100%   BMI 32.06 kg/m      Georgena Spurling, RN 05/26/2018,4:14 PM

## 2018-05-30 ENCOUNTER — Encounter: Payer: Self-pay | Admitting: Radiation Oncology

## 2018-05-30 ENCOUNTER — Ambulatory Visit
Admission: RE | Admit: 2018-05-30 | Discharge: 2018-05-30 | Disposition: A | Discharge status: Home / Self Care | Payer: Medicare Other | Source: Ambulatory Visit | Attending: Radiation Oncology | Admitting: Radiation Oncology

## 2018-05-30 ENCOUNTER — Other Ambulatory Visit: Payer: Self-pay | Admitting: Radiation Oncology

## 2018-05-30 ENCOUNTER — Other Ambulatory Visit: Payer: Self-pay

## 2018-05-30 VITALS — BP 161/70 | HR 64 | Temp 98.1°F | Resp 18 | Ht 63.0 in | Wt 181.0 lb

## 2018-05-30 DIAGNOSIS — C50412 Malignant neoplasm of upper-outer quadrant of left female breast: Secondary | ICD-10-CM | POA: Diagnosis not present

## 2018-05-30 DIAGNOSIS — Z170 Estrogen receptor positive status [ER+]: Secondary | ICD-10-CM | POA: Insufficient documentation

## 2018-05-30 DIAGNOSIS — Z79899 Other long term (current) drug therapy: Secondary | ICD-10-CM | POA: Insufficient documentation

## 2018-05-30 DIAGNOSIS — Z510 Encounter for antineoplastic radiation therapy: Secondary | ICD-10-CM | POA: Insufficient documentation

## 2018-05-30 MED ORDER — LORAZEPAM 0.5 MG PO TABS: ORAL_TABLET | ORAL | 0 refills | Status: DC

## 2018-05-31 NOTE — Progress Notes (Signed)
Radiation Oncology         [phone] ________________________________  Name: Monica Abbott        MRN: [id number]  Date of Service: 05/30/2018 DOB: [birth year]  PX:TGGYIR, Monica Na, FNP  Monica Lose, MD     REFERRING PHYSICIAN: Nicholas Lose, MD   DIAGNOSIS: The encounter diagnosis was Malignant neoplasm of upper-outer quadrant of left breast in female, estrogen receptor positive ([place]).   HISTORY OF PRESENT ILLNESS: Monica Abbott is a 67 y.o. female who was originally seen in the multidisciplinary breast clinic for a new diagnosis of left breast cancer. The patient was noted to have a screening detected mass on mammography. She presented to the [facility] and had diagnostic imaging which revealed a 7 x 7 x 5 mm mass in the 1:00 position of the left breast, and adjacent to this was a 4 mm mass with calcifications. Her axilla was negative for adenopathy. She had a biopsy of the 7 mm lesion on 03/31/18, and the 4 mm area could not be biopsied due to proximity of the skin. The 7 mm area revealed a grade 1, invasive ductal carcinoma, ER/PR positive, HER2 negative, and Ki 67 was 10%. She subsequently underwent left lumpectomy and sentinel node biopsy on 04/27/18. She had a 1.5 cm grade 1 invasive ductal carcinoma with DCIS and a benign nipple biopsy. Of her three sampled nodes, one did contain disease and had extracapsular extension. She had a mammaprint study performed on the specimen which was low risk and she will not need chemotherapy. She comes today to discuss the rationale of adjuvant radiotherapy.   PREVIOUS RADIATION THERAPY: No   PAST MEDICAL HISTORY:  Past Medical History:  Diagnosis Date  . Anemia    before hysterectomy, none now  . Cancer ([place]) 03/2018   left breast cancer  . Family history of breast cancer   . Family history of colon cancer   . Family history of ovarian cancer   . Family history of pancreatic cancer   . Hypertension        PAST SURGICAL  HISTORY: Past Surgical History:  Procedure Laterality Date  . ABDOMINAL HYSTERECTOMY    . BREAST LUMPECTOMY WITH RADIOACTIVE SEED AND SENTINEL LYMPH NODE BIOPSY Left 04/27/2018   Procedure: LEFT BREAST LUMPECTOMY WITH RADIOACTIVE SEED AND SENTINEL LYMPH NODE BIOPSY;  Surgeon: Erroll Luna, MD;  Location: [facility];  Service: General;  Laterality: Left;  . HYSTERECTOMY ABDOMINAL WITH SALPINGECTOMY    . PARATHYROIDECTOMY      FAMILY HISTORY:  Family History  Problem Relation Age of Onset  . Ovarian cancer Mother   . Colon cancer Father   . Pancreatic cancer Sister 20  . Bladder Cancer Brother 27       smoker  . Kidney cancer Brother 74  . Colon cancer Maternal Aunt        dx in her 16s  . Cancer Maternal Uncle        NOS  . Breast cancer Paternal Aunt   . Lung cancer Paternal Uncle        non-smoker  . Diabetes Maternal Grandmother   . Stroke Maternal Grandmother   . Colon cancer Maternal Grandfather   . Cancer Paternal Grandfather        NOS  . Skin cancer Sister 65  . Breast cancer Maternal Aunt        dx under 87s     SOCIAL HISTORY:  reports that she has never smoked. She  has never used smokeless tobacco. She reports that she does not drink alcohol or use drugs. The patient is married and lives in [place]. She is originally from [place]. Her son is a PA and works in a neurology subspecialty in [place].   ALLERGIES: Patient has no known allergies.   MEDICATIONS:  Current Outpatient Medications  Medication Sig Dispense Refill  . atorvastatin (LIPITOR) 10 MG tablet Take 10 mg by mouth daily.    . Cholecalciferol (VITAMIN D PO) Take 25 mcg by mouth daily.    Marland Kitchen ibuprofen (ADVIL,MOTRIN) 800 MG tablet Take 1 tablet (800 mg total) by mouth every 8 (eight) hours as needed. 30 tablet 0  . lisinopril (PRINIVIL,ZESTRIL) 10 MG tablet Take 10 mg by mouth daily.    . meloxicam (MOBIC) 15 MG tablet Take 15 mg by mouth daily.    . Multiple Vitamin  (MULTIVITAMIN WITH MINERALS) TABS tablet Take 1 tablet by mouth daily.    . Omega-3 Fatty Acids (FISH OIL) 1000 MG CPDR Take 1,000 mg by mouth daily.    Marland Kitchen oxybutynin (DITROPAN) 5 MG tablet Take 5 mg by mouth 2 (two) times daily.    Marland Kitchen LORazepam (ATIVAN) 0.5 MG tablet 1 tab po q 4-6 hours prn or 1 tab po 30 minutes prior to radiation 30 tablet 0   No current facility-administered medications for this encounter.      REVIEW OF SYSTEMS: On review of systems, the patient reports that she is doing well overall. She's had some soreness from surgery but overall reports she's feeling pretty well. She denies any chest pain, shortness of breath, cough, fevers, chills, night sweats, unintended weight changes. She denies any bowel or bladder disturbances, and denies abdominal pain, nausea or vomiting. She denies any new musculoskeletal or joint aches or pains. A complete review of systems is obtained and is otherwise negative.     PHYSICAL EXAM:  Wt Readings from Last 3 Encounters:  05/30/18 181 lb (82.1 kg)  05/04/18 187 lb 8 oz (85 kg)  04/27/18 187 lb 13.3 oz (85.2 kg)   Temp Readings from Last 3 Encounters:  05/30/18 98.1 F (36.7 C) (Oral)  05/04/18 98.3 F (36.8 C) (Oral)  04/27/18 (!) 97.5 F (36.4 C)   BP Readings from Last 3 Encounters:  05/30/18 (!) 161/70  05/04/18 131/66  04/27/18 (!) 150/53   Pulse Readings from Last 3 Encounters:  05/30/18 64  05/04/18 64  04/27/18 (!) 50     In general this is a well appearing caucasian female in no acute distress. She is alert and oriented x4 and appropriate throughout the examination. HEENT reveals that the patient is normocephalic, atraumatic. EOMs are intact. Skin is intact without any evidence of gross lesions. Cardiopulmonary assessment is negative for acute distress and she exhibits normal effort. Her left breast is intact with well healing lumpectomy and axillary incisions without erythema or fluctuance.    ECOG = 0  0 -  Asymptomatic (Fully active, able to carry on all predisease activities without restriction)  1 - Symptomatic but completely ambulatory (Restricted in physically strenuous activity but ambulatory and able to carry out work of a light or sedentary nature. For example, light housework, office work)  2 - Symptomatic, <50% in bed during the day (Ambulatory and capable of all self care but unable to carry out any work activities. Up and about more than 50% of waking hours)  3 - Symptomatic, >50% in bed, but not bedbound (Capable of only limited self-care,  confined to bed or chair 50% or more of waking hours)  4 - Bedbound (Completely disabled. Cannot carry on any self-care. Totally confined to bed or chair)  5 - Death   Eustace Pen MM, Creech RH, Tormey DC, et al. [phone]). "Toxicity and response criteria of the [facility]". [place] Oncol. 5 (6): 649-55    LABORATORY DATA:  Lab Results  Component Value Date   WBC 4.6 04/12/2018   HGB 13.9 04/12/2018   HCT 40.5 04/12/2018   MCV 86.4 04/12/2018   PLT 210 04/12/2018   Lab Results  Component Value Date   Abbott 142 04/12/2018   K 4.5 04/12/2018   CL 107 04/12/2018   CO2 27 04/12/2018   Lab Results  Component Value Date   ALT 20 04/12/2018   AST 20 04/12/2018   ALKPHOS 76 04/12/2018   BILITOT 0.4 04/12/2018      RADIOGRAPHY: No results found.     IMPRESSION/PLAN: 1. Stage IA, pT1cN1M0, grade 1 ER/PR positive invasive ductal carcinoma of the left breast. Dr. Lisbeth Renshaw discusses the pathology findings and reviews the nature of left breast disease. The consensus from the breast conference includes breast conservation with lumpectomy with  sentinel node biopsy. Depending on the size of the final tumor measurements rendered by pathology, the tumor may be tested for Oncotype Dx score to determine a role for systemic therapy. Provided that chemotherapy is not indicated, the patient's course would then be followed by external  radiotherapy to the breast followed by antiestrogen therapy. We discussed the risks, benefits, short, and long term effects of radiotherapy, and the patient is interested in proceeding. Dr. Lisbeth Renshaw discusses the delivery and logistics of radiotherapy and anticipates a course of  6 1/2 weeks of radiotherapy to the breast and regional nodes with deep inspiration breath hold technique. Written consent is obtained and placed in the chart, a copy was provided to the patient. She will simulate today following this appointment.  In a visit lasting 35 minutes, greater than 50% of the time was spent face to face discussing her case, and coordinating the patient's care.   The above documentation reflects my direct findings during this shared patient visit. Please see the separate note by Dr. Lisbeth Renshaw on this date for the remainder of the patient's plan of care.    Carola Rhine, PAC

## 2018-05-31 NOTE — Progress Notes (Signed)
  Radiation Oncology         [id number]) [phone] ________________________________  Name: Monica Abbott MRN: [id number]  Date: 05/30/2018  DOB: 03-19-1951  Optical Surface Tracking Plan:  Since intensity modulated radiotherapy (IMRT) and 3D conformal radiation treatment methods are predicated on accurate and precise positioning for treatment, intrafraction motion monitoring is medically necessary to ensure accurate and safe treatment delivery.  The ability to quantify intrafraction motion without excessive ionizing radiation dose can only be performed with optical surface tracking. Accordingly, surface imaging offers the opportunity to obtain 3D measurements of patient position throughout IMRT and 3D treatments without excessive radiation exposure.  I am ordering optical surface tracking for this patient's upcoming course of radiotherapy. ________________________________  Kyung Rudd, MD 05/31/2018 10:25 PM    Reference:   Ursula Alert, J, et al. Surface imaging-based analysis of intrafraction motion for breast radiotherapy patients.Journal of [place], n. 6, nov. 2014. ISSN [id number].   Available at: <http://www.jacmp.org/index.php/jacmp/article/view/4957>.

## 2018-05-31 NOTE — Progress Notes (Signed)
  Radiation Oncology         [id number]) [phone] ________________________________  Name: Monica Abbott MRN: [id number]  Date: 05/30/2018  DOB: Sep 07, 1950  Diagnosis DIAGNOSIS:     ICD-10-CM   1. Malignant neoplasm of upper-outer quadrant of left breast in female, estrogen receptor positive ([place]) C50.412    Z17.0      SIMULATION AND TREATMENT PLANNING NOTE  The patient presented for simulation prior to beginning her course of radiation treatment for her diagnosis of left-sided breast cancer. The patient was placed in a supine position on a breast board. A customized vac-lock bag was also constructed and this complex treatment device will be used on a daily basis during her treatment. In this fashion, a CT scan was obtained through the chest area and an isocenter was placed near the chest wall at the upper aspect of the right chest. A breath-hold technique has also been evaluated to determine if this significantly improves the spatial relationship between the target region and the heart. Based on this analysis, a breath-hold technique has not been ordered for the patient's treatment.  The patient will be planned to receive a course of radiation initially to a dose of 50.4 gray. This will consist of a 4 field technique targeting the left breast as well as the supraclavicular region. Therefore 2 customized medial and lateral tangent fields have been created targeting the chest wall, and also 2 additional customized fields have been designed to treat the supraclavicular region both with a left supraclavicular field and a left posterior axillary boost field. A forward planning/reduced field technique will also be evaluated to determine if this significantly improves the dose homogeneity of the overall plan. Therefore, additional customized blocks/fields may be necessary.  This initial treatment will be accomplished at 1.8 gray per fraction.   The initial plan will consist of a 3-D conformal technique. The  target volume/scar, heart and lungs have been contoured and dose volume histograms of each of these structures will be evaluated as part of the 3-D conformal treatment planning process.   It is anticipated that the patient will then receive a 10 gray boost to the surgical scar. This will be accomplished at 2 gray per fraction. The final anticipated total dose therefore will correspond to 60.4 gray.    _______________________________   Jodelle Gross, MD, PhD

## 2018-06-01 ENCOUNTER — Telehealth: Payer: Self-pay | Admitting: Hematology and Oncology

## 2018-06-01 NOTE — Telephone Encounter (Signed)
Tried to reach regarding 11/25

## 2018-06-02 DIAGNOSIS — Z510 Encounter for antineoplastic radiation therapy: Secondary | ICD-10-CM | POA: Diagnosis not present

## 2018-06-05 ENCOUNTER — Ambulatory Visit
Admission: RE | Admit: 2018-06-05 | Discharge: 2018-06-05 | Disposition: A | Discharge status: Home / Self Care | Payer: Medicare Other | Source: Ambulatory Visit | Attending: Radiation Oncology | Admitting: Radiation Oncology

## 2018-06-05 DIAGNOSIS — Z510 Encounter for antineoplastic radiation therapy: Secondary | ICD-10-CM | POA: Diagnosis not present

## 2018-06-06 ENCOUNTER — Ambulatory Visit
Admission: RE | Admit: 2018-06-06 | Discharge: 2018-06-06 | Disposition: A | Discharge status: Home / Self Care | Payer: Medicare Other | Source: Ambulatory Visit | Attending: Radiation Oncology | Admitting: Radiation Oncology

## 2018-06-06 DIAGNOSIS — Z510 Encounter for antineoplastic radiation therapy: Secondary | ICD-10-CM | POA: Diagnosis not present

## 2018-06-07 ENCOUNTER — Ambulatory Visit
Admission: RE | Admit: 2018-06-07 | Discharge: 2018-06-07 | Disposition: A | Discharge status: Home / Self Care | Payer: Medicare Other | Source: Ambulatory Visit | Attending: Radiation Oncology | Admitting: Radiation Oncology

## 2018-06-07 DIAGNOSIS — Z510 Encounter for antineoplastic radiation therapy: Secondary | ICD-10-CM | POA: Diagnosis not present

## 2018-06-08 ENCOUNTER — Ambulatory Visit
Admission: RE | Admit: 2018-06-08 | Discharge: 2018-06-08 | Disposition: A | Discharge status: Home / Self Care | Payer: Medicare Other | Source: Ambulatory Visit | Attending: Radiation Oncology | Admitting: Radiation Oncology

## 2018-06-08 DIAGNOSIS — Z510 Encounter for antineoplastic radiation therapy: Secondary | ICD-10-CM | POA: Diagnosis not present

## 2018-06-09 ENCOUNTER — Ambulatory Visit
Admission: RE | Admit: 2018-06-09 | Discharge: 2018-06-09 | Disposition: A | Discharge status: Home / Self Care | Payer: Medicare Other | Source: Ambulatory Visit | Attending: Radiation Oncology | Admitting: Radiation Oncology

## 2018-06-09 DIAGNOSIS — Z170 Estrogen receptor positive status [ER+]: Principal | ICD-10-CM

## 2018-06-09 DIAGNOSIS — C50412 Malignant neoplasm of upper-outer quadrant of left female breast: Secondary | ICD-10-CM

## 2018-06-09 DIAGNOSIS — Z510 Encounter for antineoplastic radiation therapy: Secondary | ICD-10-CM | POA: Diagnosis not present

## 2018-06-09 MED ORDER — RADIAPLEXRX EX GEL
Freq: Once | CUTANEOUS | Status: AC
  Administered 2018-06-09: 17:00:00 via TOPICAL

## 2018-06-09 MED ORDER — ALRA NON-METALLIC DEODORANT (RAD-ONC)
1.0000 "application " | Freq: Once | TOPICAL | Status: AC
  Administered 2018-06-09: 1 via TOPICAL

## 2018-06-09 NOTE — Progress Notes (Signed)
Pt here for patient teaching.  Pt given Radiation and You booklet, skin care instructions, Alra deodorant and Radiaplex gel.  Reviewed areas of pertinence such as fatigue, hair loss, skin changes, breast tenderness and breast swelling . Pt able to give teach back of use unscented/gentle soap,apply Radiaplex bid, avoid applying anything to skin within 4 hours of treatment, avoid wearing an under wire bra and to use an electric razor if they must shave. Pt demonstrated understanding and verbalizes understanding of information given and will contact nursing with any questions or concerns.     Gloriajean Dell. Leonie Green, BSN

## 2018-06-12 ENCOUNTER — Ambulatory Visit
Admission: RE | Admit: 2018-06-12 | Discharge: 2018-06-12 | Disposition: A | Discharge status: Home / Self Care | Payer: Medicare Other | Source: Ambulatory Visit | Attending: Radiation Oncology | Admitting: Radiation Oncology

## 2018-06-12 DIAGNOSIS — Z510 Encounter for antineoplastic radiation therapy: Secondary | ICD-10-CM | POA: Diagnosis not present

## 2018-06-13 ENCOUNTER — Ambulatory Visit
Admission: RE | Admit: 2018-06-13 | Discharge: 2018-06-13 | Disposition: A | Discharge status: Home / Self Care | Payer: Medicare Other | Source: Ambulatory Visit | Attending: Radiation Oncology | Admitting: Radiation Oncology

## 2018-06-13 DIAGNOSIS — Z510 Encounter for antineoplastic radiation therapy: Secondary | ICD-10-CM | POA: Diagnosis not present

## 2018-06-14 ENCOUNTER — Ambulatory Visit
Admission: RE | Admit: 2018-06-14 | Discharge: 2018-06-14 | Disposition: A | Discharge status: Home / Self Care | Payer: Medicare Other | Source: Ambulatory Visit | Attending: Radiation Oncology | Admitting: Radiation Oncology

## 2018-06-14 DIAGNOSIS — Z510 Encounter for antineoplastic radiation therapy: Secondary | ICD-10-CM | POA: Diagnosis not present

## 2018-06-15 ENCOUNTER — Encounter: Payer: Self-pay | Admitting: Physical Therapy

## 2018-06-15 ENCOUNTER — Ambulatory Visit: Payer: Medicare Other | Attending: Surgery | Admitting: Physical Therapy

## 2018-06-15 ENCOUNTER — Other Ambulatory Visit: Payer: Self-pay

## 2018-06-15 ENCOUNTER — Ambulatory Visit
Admission: RE | Admit: 2018-06-15 | Discharge: 2018-06-15 | Disposition: A | Discharge status: Home / Self Care | Payer: Medicare Other | Source: Ambulatory Visit | Attending: Radiation Oncology | Admitting: Radiation Oncology

## 2018-06-15 DIAGNOSIS — C50412 Malignant neoplasm of upper-outer quadrant of left female breast: Secondary | ICD-10-CM | POA: Insufficient documentation

## 2018-06-15 DIAGNOSIS — Z170 Estrogen receptor positive status [ER+]: Secondary | ICD-10-CM | POA: Diagnosis present

## 2018-06-15 DIAGNOSIS — Z510 Encounter for antineoplastic radiation therapy: Secondary | ICD-10-CM | POA: Diagnosis not present

## 2018-06-15 DIAGNOSIS — Z483 Aftercare following surgery for neoplasm: Secondary | ICD-10-CM | POA: Insufficient documentation

## 2018-06-15 DIAGNOSIS — R293 Abnormal posture: Secondary | ICD-10-CM

## 2018-06-15 NOTE — Therapy (Signed)
[place], [place], [address] Phone: [phone]   Fax:  [phone]  Physical Therapy Treatment  Patient Details  Name: Monica Abbott MRN: [id number] Date of Birth: June 12, 1951 Referring Provider (PT): Dr. Erroll Luna   Encounter Date: 06/15/2018  PT End of Session - 06/15/18 0835    Visit Number  2    Number of Visits  2    PT Start Time  0758    PT Stop Time  0835    PT Time Calculation (min)  37 min    Activity Tolerance  Patient tolerated treatment well    Behavior During Therapy  [facility] for tasks assessed/performed       Past Medical History:  Diagnosis Date  . Anemia    before hysterectomy, none now  . Cancer ([place]) 03/2018   left breast cancer  . Family history of breast cancer   . Family history of colon cancer   . Family history of ovarian cancer   . Family history of pancreatic cancer   . Hypertension     Past Surgical History:  Procedure Laterality Date  . ABDOMINAL HYSTERECTOMY    . BREAST LUMPECTOMY WITH RADIOACTIVE SEED AND SENTINEL LYMPH NODE BIOPSY Left 04/27/2018   Procedure: LEFT BREAST LUMPECTOMY WITH RADIOACTIVE SEED AND SENTINEL LYMPH NODE BIOPSY;  Surgeon: Erroll Luna, MD;  Location: [facility];  Service: General;  Laterality: Left;  . HYSTERECTOMY ABDOMINAL WITH SALPINGECTOMY    . PARATHYROIDECTOMY      There were no vitals filed for this visit.  Subjective Assessment - 06/15/18 0800    Subjective  Patient underwent a left lumpectomy and sentinel node biopsy (1/3 nodes positive) on 04/27/18. She is currently undergoing radiation for left breast and axilla. She had a Mammaprint test which showed low risk so no chemo needed.    Pertinent History  Patient was diagnosed on 02/24/18 with left grade I invasive ductal carcinoma breast cancer. Patient underwent a left lumpectomy and sentinel node biopsy (1/3 nodes positive) on 04/27/18. It ER/PR positive and  HER2 negative with a Ki67 of 10%. Current radiation treatment.    Patient Stated Goals  Make sure my arm is ok    Currently in Pain?  Yes    Pain Score  2     Pain Location  Axilla    Pain Orientation  Left    Pain Descriptors / Indicators  Aching    Pain Type  Surgical pain    Pain Onset  More than a month ago    Pain Frequency  Intermittent    Aggravating Factors   Radiation    Pain Relieving Factors  Unknown         [facility] PT Assessment - 06/15/18 0001      Assessment   Medical Diagnosis  Left breast cancer s/p lumpectomy and SLNB    Referring Provider (PT)  Dr. Marcello Moores Cornett    Onset Date/Surgical Date  04/27/18    Hand Dominance  Right    Prior Therapy  Baselines      Precautions   Precautions  Other (comment)    Precaution Comments  recent surgery      Restrictions   Weight Bearing Restrictions  No      Balance Screen   Has the patient fallen in the past 6 months  No    Has the patient had a decrease in activity level because of a fear of falling?   No  Is the patient reluctant to leave their home because of a fear of falling?   No      Home Film/video editor residence    Living Arrangements  Spouse/significant other    Available Help at Discharge  Family      Prior Function   Level of [place]  Retired    Leisure  She rides a bike or walks 4-5x/week for 15-20 min      Cognition   Overall Cognitive Status  Within Functional Limits for tasks assessed      Observation/Other Assessments   Observations  Visible mild edema present left breast just superior to her incision site. Left axillary incision appears well healed with good tissue mobility.      Posture/Postural Control   Posture/Postural Control  Postural limitations    Postural Limitations  Rounded Shoulders;Forward head      ROM / Strength   AROM / PROM / Strength  AROM      AROM   AROM Assessment Site  Shoulder    Right/Left Shoulder  Left     Left Shoulder Extension  53 Degrees    Left Shoulder Flexion  134 Degrees    Left Shoulder ABduction  138 Degrees    Left Shoulder Internal Rotation  58 Degrees    Left Shoulder External Rotation  73 Degrees        LYMPHEDEMA/ONCOLOGY QUESTIONNAIRE - 06/15/18 0806      Type   Cancer Type  Left breast cancer      Surgeries   Lumpectomy Date  04/27/18    Sentinel Lymph Node Biopsy Date  04/27/18    Number Lymph Nodes Removed  3      Treatment   Active Chemotherapy Treatment  No    Past Chemotherapy Treatment  No    Active Radiation Treatment  Yes    Date  06/05/18    Body Site  left breast and axilla    Past Radiation Treatment  No    Current Hormone Treatment  No    Past Hormone Therapy  No      What other symptoms do you have   Are you Having Heaviness or Tightness  Yes    Are you having Pain  Yes    Are you having pitting edema  No    Is it Hard or Difficult finding clothes that fit  No    Do you have infections  No    Is there Decreased scar mobility  No    Stemmer Sign  No      Lymphedema Assessments   Lymphedema Assessments  Upper extremities      Right Upper Extremity Lymphedema   10 cm Proximal to Olecranon Process  28.5 cm    Olecranon Process  22.7 cm    10 cm Proximal to Ulnar Styloid Process  22.4 cm    Just Proximal to Ulnar Styloid Process  15.3 cm    Across Hand at [employer]  18.2 cm    At [place] of 2nd Digit  5.9 cm      Left Upper Extremity Lymphedema   10 cm Proximal to Olecranon Process  29.8 cm    Olecranon Process  23.3 cm    10 cm Proximal to Ulnar Styloid Process  22.2 cm    Just Proximal to Ulnar Styloid Process  15.5 cm    Across Hand at [employer]  18 cm    At [facility] of 2nd Digit  5.8 cm        Quick Dash - 06/15/18 0001    Open a tight or new jar  Mild difficulty    Do heavy household chores (wash walls, wash floors)  Mild difficulty    Carry a shopping bag or briefcase  No difficulty    Wash your back  No difficulty     Use a knife to cut food  No difficulty    Recreational activities in which you take some force or impact through your arm, shoulder, or hand (golf, hammering, tennis)  No difficulty    During the past week, to what extent has your arm, shoulder or hand problem interfered with your normal social activities with family, friends, neighbors, or groups?  Not at all    During the past week, to what extent has your arm, shoulder or hand problem limited your work or other regular daily activities  Not at all    Arm, shoulder, or hand pain.  None    Tingling (pins and needles) in your arm, shoulder, or hand  None    Difficulty Sleeping  No difficulty    DASH Score  4.55 %                     PT Education - 06/15/18 0835    Education Details  ABC class packet; lymphedema risk reduction; overpressure with abduction and flexion stretches given pre-operatively    Person(s) Educated  Patient    Methods  Demonstration;Handout;Explanation    Comprehension  Verbalized understanding;Returned demonstration          PT Long Term Goals - 06/15/18 1015      PT LONG TERM GOAL #1   Title  Patient will demonstrate she has returned to baseline related to shoulder ROM and function post operatively.    Time  8    Period  Weeks    Status  Achieved            Plan - 06/15/18 0835    Clinical Impression Statement  Patient is doing very well s/p left lumpectomy and SLNB. She appears to have some mild edema in her left breast and was educated on getting a compresison bra for that and a compression sleeve for flying. Her ROM is almost back to baseline and she reports no functional deficits. She lacks the last few degrees of flexion and abduction but that will be addressed by her continuing her HEP. She was encouraged to contact us with any concerns but is overall doing very well.     Rehab Potential  Excellent    PT Treatment/Interventions  ADLs/Self Care Home Management;Therapeutic  exercise;Patient/family education    PT Next Visit Plan  D/C - goals met    PT Home Exercise Plan  Post op shoulder ROM HEP     Consulted and Agree with Plan of Care  Patient       Patient will benefit from skilled therapeutic intervention in order to improve the following deficits and impairments:  Impaired UE functional use, Decreased range of motion, Pain, Decreased knowledge of precautions, Postural dysfunction  Visit Diagnosis: Malignant neoplasm of upper-outer quadrant of left breast in female, estrogen receptor positive ([facility])  Abnormal posture  Aftercare following surgery for neoplasm     Problem List Patient Active Problem List   Diagnosis Date Noted  . Genetic testing 04/25/2018  . Family history of breast cancer   .  Family history of colon cancer   . Family history of pancreatic cancer   . Family history of ovarian cancer   . Malignant neoplasm of upper-outer quadrant of left breast in female, estrogen receptor positive ([place]) 04/05/2018   PHYSICAL THERAPY DISCHARGE SUMMARY  Visits from Start of Care: 2  Current functional level related to goals / functional outcomes: Goals met; see above for objective measurements.   Remaining deficits: None   Education / Equipment: HEP and lymphedema risk reduction information. Plan: Patient agrees to discharge.  Patient goals were met. Patient is being discharged due to meeting the stated rehab goals.  ?????        Annia Friendly, [place] 06/15/18 10:16 AM   [place], [place], [address] Phone: [phone]   Fax:  [phone]  Name: Monica Abbott MRN: [id number] Date of Birth: 25-Mar-1951

## 2018-06-16 ENCOUNTER — Ambulatory Visit
Admission: RE | Admit: 2018-06-16 | Discharge: 2018-06-16 | Disposition: A | Discharge status: Home / Self Care | Payer: Medicare Other | Source: Ambulatory Visit | Attending: Radiation Oncology | Admitting: Radiation Oncology

## 2018-06-16 DIAGNOSIS — Z510 Encounter for antineoplastic radiation therapy: Secondary | ICD-10-CM | POA: Diagnosis not present

## 2018-06-19 ENCOUNTER — Ambulatory Visit
Admission: RE | Admit: 2018-06-19 | Discharge: 2018-06-19 | Disposition: A | Discharge status: Home / Self Care | Payer: Medicare Other | Source: Ambulatory Visit | Attending: Radiation Oncology | Admitting: Radiation Oncology

## 2018-06-19 DIAGNOSIS — Z510 Encounter for antineoplastic radiation therapy: Secondary | ICD-10-CM | POA: Diagnosis not present

## 2018-06-20 ENCOUNTER — Ambulatory Visit
Admission: RE | Admit: 2018-06-20 | Discharge: 2018-06-20 | Disposition: A | Discharge status: Home / Self Care | Payer: Medicare Other | Source: Ambulatory Visit | Attending: Radiation Oncology | Admitting: Radiation Oncology

## 2018-06-20 DIAGNOSIS — Z510 Encounter for antineoplastic radiation therapy: Secondary | ICD-10-CM | POA: Diagnosis not present

## 2018-06-21 ENCOUNTER — Ambulatory Visit
Admission: RE | Admit: 2018-06-21 | Discharge: 2018-06-21 | Disposition: A | Discharge status: Home / Self Care | Payer: Medicare Other | Source: Ambulatory Visit | Attending: Radiation Oncology | Admitting: Radiation Oncology

## 2018-06-21 DIAGNOSIS — Z510 Encounter for antineoplastic radiation therapy: Secondary | ICD-10-CM | POA: Diagnosis not present

## 2018-06-22 ENCOUNTER — Ambulatory Visit
Admission: RE | Admit: 2018-06-22 | Discharge: 2018-06-22 | Disposition: A | Discharge status: Home / Self Care | Payer: Medicare Other | Source: Ambulatory Visit | Attending: Radiation Oncology | Admitting: Radiation Oncology

## 2018-06-22 DIAGNOSIS — Z510 Encounter for antineoplastic radiation therapy: Secondary | ICD-10-CM | POA: Diagnosis not present

## 2018-06-23 ENCOUNTER — Ambulatory Visit
Admission: RE | Admit: 2018-06-23 | Discharge: 2018-06-23 | Disposition: A | Discharge status: Home / Self Care | Payer: Medicare Other | Source: Ambulatory Visit | Attending: Radiation Oncology | Admitting: Radiation Oncology

## 2018-06-23 DIAGNOSIS — Z170 Estrogen receptor positive status [ER+]: Secondary | ICD-10-CM | POA: Insufficient documentation

## 2018-06-23 DIAGNOSIS — C50412 Malignant neoplasm of upper-outer quadrant of left female breast: Secondary | ICD-10-CM | POA: Diagnosis present

## 2018-06-23 DIAGNOSIS — Z510 Encounter for antineoplastic radiation therapy: Secondary | ICD-10-CM | POA: Insufficient documentation

## 2018-06-26 ENCOUNTER — Ambulatory Visit
Admission: RE | Admit: 2018-06-26 | Discharge: 2018-06-26 | Disposition: A | Discharge status: Home / Self Care | Payer: Medicare Other | Source: Ambulatory Visit | Attending: Radiation Oncology | Admitting: Radiation Oncology

## 2018-06-26 DIAGNOSIS — Z510 Encounter for antineoplastic radiation therapy: Secondary | ICD-10-CM | POA: Diagnosis not present

## 2018-06-27 ENCOUNTER — Ambulatory Visit
Admission: RE | Admit: 2018-06-27 | Discharge: 2018-06-27 | Disposition: A | Discharge status: Home / Self Care | Payer: Medicare Other | Source: Ambulatory Visit | Attending: Radiation Oncology | Admitting: Radiation Oncology

## 2018-06-27 DIAGNOSIS — Z510 Encounter for antineoplastic radiation therapy: Secondary | ICD-10-CM | POA: Diagnosis not present

## 2018-06-28 ENCOUNTER — Ambulatory Visit
Admission: RE | Admit: 2018-06-28 | Discharge: 2018-06-28 | Disposition: A | Discharge status: Home / Self Care | Payer: Medicare Other | Source: Ambulatory Visit | Attending: Radiation Oncology | Admitting: Radiation Oncology

## 2018-06-28 DIAGNOSIS — Z510 Encounter for antineoplastic radiation therapy: Secondary | ICD-10-CM | POA: Diagnosis not present

## 2018-06-29 ENCOUNTER — Ambulatory Visit
Admission: RE | Admit: 2018-06-29 | Discharge: 2018-06-29 | Disposition: A | Discharge status: Home / Self Care | Payer: Medicare Other | Source: Ambulatory Visit | Attending: Radiation Oncology | Admitting: Radiation Oncology

## 2018-06-29 DIAGNOSIS — Z510 Encounter for antineoplastic radiation therapy: Secondary | ICD-10-CM | POA: Diagnosis not present

## 2018-06-30 ENCOUNTER — Ambulatory Visit: Payer: Medicare Other | Admitting: Radiation Oncology

## 2018-06-30 ENCOUNTER — Ambulatory Visit
Admission: RE | Admit: 2018-06-30 | Discharge: 2018-06-30 | Disposition: A | Discharge status: Home / Self Care | Payer: Medicare Other | Source: Ambulatory Visit | Attending: Radiation Oncology | Admitting: Radiation Oncology

## 2018-06-30 DIAGNOSIS — Z510 Encounter for antineoplastic radiation therapy: Secondary | ICD-10-CM | POA: Diagnosis not present

## 2018-07-03 ENCOUNTER — Ambulatory Visit
Admission: RE | Admit: 2018-07-03 | Discharge: 2018-07-03 | Disposition: A | Discharge status: Home / Self Care | Payer: Medicare Other | Source: Ambulatory Visit | Attending: Radiation Oncology | Admitting: Radiation Oncology

## 2018-07-03 DIAGNOSIS — Z510 Encounter for antineoplastic radiation therapy: Secondary | ICD-10-CM | POA: Diagnosis not present

## 2018-07-04 ENCOUNTER — Ambulatory Visit
Admission: RE | Admit: 2018-07-04 | Discharge: 2018-07-04 | Disposition: A | Discharge status: Home / Self Care | Payer: Medicare Other | Source: Ambulatory Visit | Attending: Radiation Oncology | Admitting: Radiation Oncology

## 2018-07-04 DIAGNOSIS — Z510 Encounter for antineoplastic radiation therapy: Secondary | ICD-10-CM | POA: Diagnosis not present

## 2018-07-05 ENCOUNTER — Ambulatory Visit
Admission: RE | Admit: 2018-07-05 | Discharge: 2018-07-05 | Disposition: A | Discharge status: Home / Self Care | Payer: Medicare Other | Source: Ambulatory Visit | Attending: Radiation Oncology | Admitting: Radiation Oncology

## 2018-07-05 DIAGNOSIS — Z510 Encounter for antineoplastic radiation therapy: Secondary | ICD-10-CM | POA: Diagnosis not present

## 2018-07-06 ENCOUNTER — Ambulatory Visit
Admission: RE | Admit: 2018-07-06 | Discharge: 2018-07-06 | Disposition: A | Discharge status: Home / Self Care | Payer: Medicare Other | Source: Ambulatory Visit | Attending: Radiation Oncology | Admitting: Radiation Oncology

## 2018-07-06 DIAGNOSIS — Z510 Encounter for antineoplastic radiation therapy: Secondary | ICD-10-CM | POA: Diagnosis not present

## 2018-07-07 ENCOUNTER — Ambulatory Visit
Admission: RE | Admit: 2018-07-07 | Discharge: 2018-07-07 | Disposition: A | Discharge status: Home / Self Care | Payer: Medicare Other | Source: Ambulatory Visit | Attending: Radiation Oncology | Admitting: Radiation Oncology

## 2018-07-07 ENCOUNTER — Ambulatory Visit: Payer: Medicare Other | Admitting: Radiation Oncology

## 2018-07-07 DIAGNOSIS — C50412 Malignant neoplasm of upper-outer quadrant of left female breast: Secondary | ICD-10-CM

## 2018-07-07 DIAGNOSIS — Z510 Encounter for antineoplastic radiation therapy: Secondary | ICD-10-CM | POA: Diagnosis not present

## 2018-07-07 DIAGNOSIS — Z170 Estrogen receptor positive status [ER+]: Principal | ICD-10-CM

## 2018-07-07 MED ORDER — RADIAPLEXRX EX GEL
Freq: Once | CUTANEOUS | Status: AC
  Administered 2018-07-07: 17:00:00 via TOPICAL

## 2018-07-10 ENCOUNTER — Ambulatory Visit
Admission: RE | Admit: 2018-07-10 | Discharge: 2018-07-10 | Disposition: A | Discharge status: Home / Self Care | Payer: Medicare Other | Source: Ambulatory Visit | Attending: Radiation Oncology | Admitting: Radiation Oncology

## 2018-07-10 DIAGNOSIS — Z510 Encounter for antineoplastic radiation therapy: Secondary | ICD-10-CM | POA: Diagnosis not present

## 2018-07-11 ENCOUNTER — Ambulatory Visit
Admission: RE | Admit: 2018-07-11 | Discharge: 2018-07-11 | Disposition: A | Discharge status: Home / Self Care | Payer: Medicare Other | Source: Ambulatory Visit | Attending: Radiation Oncology | Admitting: Radiation Oncology

## 2018-07-11 DIAGNOSIS — Z510 Encounter for antineoplastic radiation therapy: Secondary | ICD-10-CM | POA: Diagnosis not present

## 2018-07-12 ENCOUNTER — Ambulatory Visit
Admission: RE | Admit: 2018-07-12 | Discharge: 2018-07-12 | Disposition: A | Discharge status: Home / Self Care | Payer: Medicare Other | Source: Ambulatory Visit | Attending: Radiation Oncology | Admitting: Radiation Oncology

## 2018-07-12 DIAGNOSIS — Z510 Encounter for antineoplastic radiation therapy: Secondary | ICD-10-CM | POA: Diagnosis not present

## 2018-07-13 ENCOUNTER — Ambulatory Visit
Admission: RE | Admit: 2018-07-13 | Discharge: 2018-07-13 | Disposition: A | Discharge status: Home / Self Care | Payer: Medicare Other | Source: Ambulatory Visit | Attending: Radiation Oncology | Admitting: Radiation Oncology

## 2018-07-13 DIAGNOSIS — Z510 Encounter for antineoplastic radiation therapy: Secondary | ICD-10-CM | POA: Diagnosis not present

## 2018-07-14 ENCOUNTER — Ambulatory Visit
Admission: RE | Admit: 2018-07-14 | Discharge: 2018-07-14 | Disposition: A | Discharge status: Home / Self Care | Payer: Medicare Other | Source: Ambulatory Visit | Attending: Radiation Oncology | Admitting: Radiation Oncology

## 2018-07-14 DIAGNOSIS — Z510 Encounter for antineoplastic radiation therapy: Secondary | ICD-10-CM | POA: Diagnosis not present

## 2018-07-17 ENCOUNTER — Ambulatory Visit
Admission: RE | Admit: 2018-07-17 | Discharge: 2018-07-17 | Disposition: A | Discharge status: Home / Self Care | Payer: Medicare Other | Source: Ambulatory Visit | Attending: Radiation Oncology | Admitting: Radiation Oncology

## 2018-07-17 ENCOUNTER — Inpatient Hospital Stay: Payer: Medicare Other | Attending: Hematology and Oncology | Admitting: Hematology and Oncology

## 2018-07-17 ENCOUNTER — Telehealth: Payer: Self-pay | Admitting: Hematology and Oncology

## 2018-07-17 DIAGNOSIS — C50412 Malignant neoplasm of upper-outer quadrant of left female breast: Secondary | ICD-10-CM

## 2018-07-17 DIAGNOSIS — Z170 Estrogen receptor positive status [ER+]: Secondary | ICD-10-CM | POA: Diagnosis not present

## 2018-07-17 DIAGNOSIS — Z510 Encounter for antineoplastic radiation therapy: Secondary | ICD-10-CM | POA: Diagnosis not present

## 2018-07-17 MED ORDER — LETROZOLE 2.5 MG PO TABS: 2.5000 mg | ORAL_TABLET | Freq: Every day | ORAL | 3 refills | Status: DC

## 2018-07-17 NOTE — Assessment & Plan Note (Signed)
Left lumpectomy: IDC grade 1, 1.5 cm, DCIS intermediate grade, negative for lymphovascular or perineural invasion, 1/3 lymph nodes positive with extranodal extension, ER 100%, PR 100%, HER-2 negative, Ki-67 10%, T1c and 1 a stage Ib Mammaprint low risk luminal type a  Recommendation: 1.  Adjuvant radiation 06/06/2018 to 07/17/2018 2.  Followed by adjuvant antiestrogen therapy with letrozole 2.5 mg daily x7 years  Letrozole counseling:We discussed the risks and benefits of anti-estrogen therapy with aromatase inhibitors. These include but not limited to insomnia, hot flashes, mood changes, vaginal dryness, bone density loss, and weight gain. We strongly believe that the benefits far outweigh the risks. Patient understands these risks and consented to starting treatment. Planned treatment duration is 7 years.  Return to clinic in 3 months for survivorship care plan visit

## 2018-07-17 NOTE — Progress Notes (Signed)
Patient Care Team: Claudine Mouton, FNP as PCP - General (Family Medicine) Erroll Luna, MD as Consulting Physician (General Surgery) Nicholas Lose, MD as Consulting Physician (Hematology and Oncology) Kyung Rudd, MD as Consulting Physician (Radiation Oncology)  DIAGNOSIS:  Encounter Diagnosis  Name Primary?  . Malignant neoplasm of upper-outer quadrant of left breast in female, estrogen receptor positive ([place])     SUMMARY OF ONCOLOGIC HISTORY:   Malignant neoplasm of upper-outer quadrant of left breast in female, estrogen receptor positive ([place])   03/31/2018 Initial Diagnosis    Screening mammogram detected retroareolar solid and cystic mass at 1 o'clock position measuring 0.7 cm, adjacent smaller mass 0.4 cm not biopsied.  Biopsy of the retroareolar mass is grade 1 invasive ductal carcinoma ER 100%, PR 100%, Ki-67 10%, HER-2 negative, T1b N0 stage Ia AJCC 8    04/24/2018 Genetic Testing    PTCH1 c.324G>A VUS identified on the multi-cancer panel.  The Multi-Gene Panel offered by [facility] includes sequencing and/or deletion duplication testing of the following 84 genes: AIP, ALK, APC, ATM, AXIN2,BAP1,  BARD1, BLM, BMPR1A, BRCA1, BRCA2, BRIP1, CASR, CDC73, CDH1, CDK4, CDKN1B, CDKN1C, CDKN2A (p14ARF), CDKN2A (p16INK4a), CEBPA, CHEK2, CTNNA1, DICER1, DIS3L2, EGFR (c.2369C>T, p.Thr790Met variant only), EPCAM (Deletion/duplication testing only), FH, FLCN, GATA2, GPC3, GREM1 (Promoter region deletion/duplication testing only), HOXB13 (c.251G>A, p.Gly84Glu), HRAS, KIT, MAX, MEN1, MET, MITF (c.952G>A, p.Glu318Lys variant only), MLH1, MSH2, MSH3, MSH6, MUTYH, NBN, NF1, NF2, NTHL1, PALB2, PDGFRA, PHOX2B, PMS2, POLD1, POLE, POT1, PRKAR1A, PTCH1, PTEN, RAD50, RAD51C, RAD51D, RB1, RECQL4, RET, RUNX1, SDHAF2, SDHA (sequence changes only), SDHB, SDHC, SDHD, SMAD4, SMARCA4, SMARCB1, SMARCE1, STK11, SUFU, TERC, TERT, TMEM127, TP53, TSC1, TSC2, VHL, WRN and WT1.  The report date is April 24, 2018.    04/27/2018 Surgery    Left lumpectomy: IDC grade 1, 1.5 cm, DCIS intermediate grade, negative for lymphovascular or perineural invasion, 1/3 lymph nodes positive with extranodal extension, ER 100%, PR 100%, HER-2 negative, Ki-67 10%, T1cN1a stage Ib    06/06/2018 - 07/17/2018 Radiation Therapy    Adjuvant radiation therapy     CHIEF COMPLIANT: Follow-up towards end of radiation to discuss antiestrogen therapy  INTERVAL HISTORY: Monica Abbott is a 33-year with above-mentioned his left breast cancer treated with lumpectomy and radiation and is here today to discuss starting antiestrogen therapy.  She has significant radiation dermatitis.  She also has complaints of fatigue.  REVIEW OF SYSTEMS:   Constitutional: Fatigue Eyes: Denies blurriness of vision Ears, nose, mouth, throat, and face: Denies mucositis or sore throat Respiratory: Denies cough, dyspnea or wheezes Cardiovascular: Denies palpitation, chest discomfort Gastrointestinal:  Denies nausea, heartburn or change in bowel habits Skin: Denies abnormal skin rashes Lymphatics: Denies new lymphadenopathy or easy bruising Neurological:Denies numbness, tingling or new weaknesses Behavioral/Psych: Mood is stable, no new changes  Extremities: No lower extremity edema Breast: Radiation dermatitis All other systems were reviewed with the patient and are negative.  I have reviewed the past medical history, past surgical history, social history and family history with the patient and they are unchanged from previous note.  ALLERGIES:  has No Known Allergies.  MEDICATIONS:  Current Outpatient Medications  Medication Sig Dispense Refill  . atorvastatin (LIPITOR) 10 MG tablet Take 10 mg by mouth daily.    . Cholecalciferol (VITAMIN D PO) Take 25 mcg by mouth daily.    Marland Kitchen ibuprofen (ADVIL,MOTRIN) 800 MG tablet Take 1 tablet (800 mg total) by mouth every 8 (eight) hours as needed. 30 tablet 0  . letrozole (FEMARA) 2.5 MG  tablet Take 1 tablet  (2.5 mg total) by mouth daily. 90 tablet 3  . lisinopril (PRINIVIL,ZESTRIL) 10 MG tablet Take 10 mg by mouth daily.    Marland Kitchen LORazepam (ATIVAN) 0.5 MG tablet 1 tab po q 4-6 hours prn or 1 tab po 30 minutes prior to radiation 30 tablet 0  . meloxicam (MOBIC) 15 MG tablet Take 15 mg by mouth daily.    . Multiple Vitamin (MULTIVITAMIN WITH MINERALS) TABS tablet Take 1 tablet by mouth daily.    . Omega-3 Fatty Acids (FISH OIL) 1000 MG CPDR Take 1,000 mg by mouth daily.    Marland Kitchen oxybutynin (DITROPAN) 5 MG tablet Take 5 mg by mouth 2 (two) times daily.     No current facility-administered medications for this visit.     PHYSICAL EXAMINATION: ECOG PERFORMANCE STATUS: 1 - Symptomatic but completely ambulatory  Vitals:   07/17/18 1259  BP: 138/63  Pulse: 89  Resp: 18  Temp: (!) 97.3 F (36.3 C)  SpO2: 99%   Filed Weights   07/17/18 1259  Weight: 176 lb 12.8 oz (80.2 kg)    GENERAL:alert, no distress and comfortable SKIN: skin color, texture, turgor are normal, no rashes or significant lesions EYES: normal, Conjunctiva are pink and non-injected, sclera clear OROPHARYNX:no exudate, no erythema and lips, buccal mucosa, and tongue normal  NECK: supple, thyroid normal size, non-tender, without nodularity LYMPH:  no palpable lymphadenopathy in the cervical, axillary or inguinal LUNGS: clear to auscultation and percussion with normal breathing effort HEART: regular rate & rhythm and no murmurs and no lower extremity edema ABDOMEN:abdomen soft, non-tender and normal bowel sounds MUSCULOSKELETAL:no cyanosis of digits and no clubbing  NEURO: alert & oriented x 3 with fluent speech, no focal motor/sensory deficits EXTREMITIES: No lower extremity edema   LABORATORY DATA:  I have reviewed the data as listed CMP Latest Ref Rng & Units 04/12/2018  Glucose 70 - 99 mg/dL 106(H)  BUN 8 - 23 mg/dL 17  Creatinine 0.44 - 1.00 mg/dL 0.99  Sodium 135 - 145 mmol/L 142  Potassium 3.5 - 5.1 mmol/L 4.5  Chloride  98 - 111 mmol/L 107  CO2 22 - 32 mmol/L 27  Calcium 8.9 - 10.3 mg/dL 9.8  Total Protein 6.5 - 8.1 g/dL 7.4  Total Bilirubin 0.3 - 1.2 mg/dL 0.4  Alkaline Phos 38 - 126 U/L 76  AST 15 - 41 U/L 20  ALT 0 - 44 U/L 20    Lab Results  Component Value Date   WBC 4.6 04/12/2018   HGB 13.9 04/12/2018   HCT 40.5 04/12/2018   MCV 86.4 04/12/2018   PLT 210 04/12/2018   NEUTROABS 2.7 04/12/2018    ASSESSMENT & PLAN:  Malignant neoplasm of upper-outer quadrant of left breast in female, estrogen receptor positive ([facility]) Left lumpectomy: IDC grade 1, 1.5 cm, DCIS intermediate grade, negative for lymphovascular or perineural invasion, 1/3 lymph nodes positive with extranodal extension, ER 100%, PR 100%, HER-2 negative, Ki-67 10%, T1c and 1 a stage Ib Mammaprint low risk luminal type a  Recommendation: 1.  Adjuvant radiation 06/06/2018 to 07/17/2018 2.  Followed by adjuvant antiestrogen therapy with letrozole 2.5 mg daily x7 years  Letrozole counseling:We discussed the risks and benefits of anti-estrogen therapy with aromatase inhibitors. These include but not limited to insomnia, hot flashes, mood changes, vaginal dryness, bone density loss, and weight gain. We strongly believe that the benefits far outweigh the risks. Patient understands these risks and consented to starting treatment. Planned treatment duration is  7 years.  Return to clinic in 3 months for survivorship care plan visit   No orders of the defined types were placed in this encounter.  The patient has a good understanding of the overall plan. she agrees with it. she will call with any problems that may develop before the next visit here.   Harriette Ohara, MD 07/17/18

## 2018-07-17 NOTE — Telephone Encounter (Signed)
Gave patient avs and calender.

## 2018-07-18 ENCOUNTER — Ambulatory Visit
Admission: RE | Admit: 2018-07-18 | Discharge: 2018-07-18 | Disposition: A | Discharge status: Home / Self Care | Payer: Medicare Other | Source: Ambulatory Visit | Attending: Radiation Oncology | Admitting: Radiation Oncology

## 2018-07-18 DIAGNOSIS — Z510 Encounter for antineoplastic radiation therapy: Secondary | ICD-10-CM | POA: Diagnosis not present

## 2018-07-19 ENCOUNTER — Ambulatory Visit
Admission: RE | Admit: 2018-07-19 | Discharge: 2018-07-19 | Disposition: A | Discharge status: Home / Self Care | Payer: Medicare Other | Source: Ambulatory Visit | Attending: Radiation Oncology | Admitting: Radiation Oncology

## 2018-07-19 DIAGNOSIS — Z510 Encounter for antineoplastic radiation therapy: Secondary | ICD-10-CM | POA: Diagnosis not present

## 2018-07-24 ENCOUNTER — Ambulatory Visit: Payer: Medicare Other

## 2018-07-26 ENCOUNTER — Encounter: Payer: Self-pay | Admitting: *Deleted

## 2018-07-28 ENCOUNTER — Encounter: Payer: Self-pay | Admitting: Radiation Oncology

## 2018-07-28 NOTE — Progress Notes (Signed)
  Radiation Oncology         [phone] ________________________________  Name: Monica Abbott MRN: [id number]  Date: 07/28/2018  DOB: 1950/10/04  End of Treatment Note  Diagnosis:   Left-sided breast cancer     Indication for treatment:  Curative       Radiation treatment dates:   06/05/18 - 07/19/18  Site/dose:   The patient initially received a dose of 50.4 Gy in 28 fractions to the breast and supraclavicular region using whole-breast tangent fields. This was delivered using a 3-D conformal technique. The patient then received a boost to the seroma. This delivered an additional 10 Gy in 5 fractions using a Complex Isodose technique. The total dose was 60.4 Gy.  Narrative: The patient tolerated radiation treatment relatively well. The patient had some expected skin irritation as she progressed during treatment. Moist desquamation was present in the IM fold and axilla at the end of treatment. She experienced diffuse erythema and hyperpigmentation.  Plan: The patient has completed radiation treatment. The patient will return to radiation oncology clinic for routine followup in one month. I advised the patient to call or return sooner if they have any questions or concerns related to their recovery or treatment. ________________________________  Jodelle Gross, M.D., Ph.D.  This document serves as a record of services personally performed by Kyung Rudd, MD. It was created on his behalf by Wilburn Mylar, a trained medical scribe. The creation of this record is based on the scribe's personal observations and the provider's statements to them. This document has been checked and approved by the attending provider.

## 2018-08-28 ENCOUNTER — Ambulatory Visit
Admission: RE | Admit: 2018-08-28 | Discharge: 2018-08-28 | Disposition: A | Discharge status: Home / Self Care | Payer: Medicare Other | Source: Ambulatory Visit | Attending: Radiation Oncology | Admitting: Radiation Oncology

## 2018-08-28 ENCOUNTER — Other Ambulatory Visit: Payer: Self-pay

## 2018-08-28 ENCOUNTER — Encounter: Payer: Self-pay | Admitting: Radiation Oncology

## 2018-08-28 VITALS — BP 160/68 | HR 55 | Temp 97.7°F | Resp 18 | Wt 176.2 lb

## 2018-08-28 DIAGNOSIS — C50912 Malignant neoplasm of unspecified site of left female breast: Secondary | ICD-10-CM | POA: Diagnosis not present

## 2018-08-28 DIAGNOSIS — Z79899 Other long term (current) drug therapy: Secondary | ICD-10-CM | POA: Diagnosis not present

## 2018-08-28 DIAGNOSIS — Z170 Estrogen receptor positive status [ER+]: Secondary | ICD-10-CM | POA: Insufficient documentation

## 2018-08-28 DIAGNOSIS — C50412 Malignant neoplasm of upper-outer quadrant of left female breast: Secondary | ICD-10-CM

## 2018-08-28 NOTE — Progress Notes (Signed)
Radiation Oncology         [phone] ________________________________  Name: Monica Abbott MRN: [id number]  Date of Service: 08/28/2018  DOB: [birth year]  Post Treatment Note  CC: Claudine Mouton, FNP  Nicholas Lose, MD  Diagnosis:   Stage IA, pT1cN1M0, grade 1 ER/PR positive invasive ductal carcinoma of the left breast.  Interval Since Last Radiation:  6 weeks   06/05/18 - 07/19/18: The patient initially received a dose of 50.4 Gy in 28 fractions to the breast and supraclavicular region using whole-breast tangent fields. This was delivered using a 3-D conformal technique. The patient then received a boost to the seroma. This delivered an additional 10 Gy in 5 fractions using a Complex Isodose technique. The total dose was 60.4 Gy.  Narrative:  The patient returns today for routine follow-up. During treatment she did very well with radiotherapy though she did have moist desquamation along the inframammary fold and axilla.                             On review of systems, the patient states she's doing well and denies any concerns with her skin at this time. She's been using vitamin e oil. She has occasional breast sensitivity if she accidentally brushes up against something. No other complaints are noted.  ALLERGIES:  has No Known Allergies.  Meds: Current Outpatient Medications  Medication Sig Dispense Refill  . atorvastatin (LIPITOR) 10 MG tablet Take 10 mg by mouth daily.    . Cholecalciferol (VITAMIN D PO) Take 25 mcg by mouth daily.    Marland Kitchen ibuprofen (ADVIL,MOTRIN) 800 MG tablet Take 1 tablet (800 mg total) by mouth every 8 (eight) hours as needed. 30 tablet 0  . letrozole (FEMARA) 2.5 MG tablet Take 1 tablet (2.5 mg total) by mouth daily. 90 tablet 3  . lisinopril (PRINIVIL,ZESTRIL) 10 MG tablet Take 10 mg by mouth daily.    Marland Kitchen LORazepam (ATIVAN) 0.5 MG tablet 1 tab po q 4-6 hours prn or 1 tab po 30 minutes prior to radiation 30 tablet 0  . meloxicam (MOBIC) 15 MG tablet Take  15 mg by mouth daily.    . Multiple Vitamin (MULTIVITAMIN WITH MINERALS) TABS tablet Take 1 tablet by mouth daily.    . Omega-3 Fatty Acids (FISH OIL) 1000 MG CPDR Take 1,000 mg by mouth daily.    Marland Kitchen oxybutynin (DITROPAN) 5 MG tablet Take 5 mg by mouth 2 (two) times daily.     No current facility-administered medications for this encounter.     Physical Findings:  weight is 176 lb 3.2 oz (79.9 kg). Her oral temperature is 97.7 F (36.5 C). Her blood pressure is 160/68 (abnormal) and her pulse is 55 (abnormal). Her respiration is 18 and oxygen saturation is 100%.  Pain Assessment Pain Score: 0-No pain/10 In general this is a well appearing caucasian female in no acute distress. She's alert and oriented x4 and appropriate throughout the examination. Cardiopulmonary assessment is negative for acute distress and she exhibits normal effort. The left breast was examined and reveals mild hyperpigmentation along the inframammary fold and axilla. No desquamation is noted.   Lab Findings: Lab Results  Component Value Date   WBC 4.6 04/12/2018   HGB 13.9 04/12/2018   HCT 40.5 04/12/2018   MCV 86.4 04/12/2018   PLT 210 04/12/2018     Radiographic Findings: No results found.  Impression/Plan: 1. Stage IA, pT1cN1M0, grade 1 ER/PR positive invasive  ductal carcinoma of the left breast. The patient has been doing well since completion of radiotherapy. We discussed that we would be happy to continue to follow her as needed, but she will also continue to follow up with Dr. Lindi Adie in medical oncology. She was counseled on skin care as well as measures to avoid sun exposure to this area.  2. Survivorship. We discussed the importance of survivorship evaluation and she is currently scheduled for this in the near future. She was also given the monthly calendar for access to resources offered within the cancer center. 3. HTN. The patient's BP increases when she's at the cancer center. She assures me she is  taking her medication and is not symptomatic and will keep an eye on her pressures at home.     Carola Rhine, PAC

## 2018-10-25 ENCOUNTER — Telehealth: Payer: Self-pay

## 2018-10-25 NOTE — Telephone Encounter (Signed)
Spoke with patient reminding of SCP visit with NP on 10/30/18 at 2 pm.  Patient said she will come to appt.

## 2018-10-30 ENCOUNTER — Encounter: Payer: Self-pay | Admitting: Adult Health

## 2018-10-30 ENCOUNTER — Inpatient Hospital Stay: Payer: Medicare Other | Attending: Adult Health | Admitting: Adult Health

## 2018-10-30 ENCOUNTER — Telehealth: Payer: Self-pay | Admitting: Hematology and Oncology

## 2018-10-30 VITALS — BP 129/69 | HR 54 | Temp 98.4°F | Resp 18 | Ht 63.0 in | Wt 176.1 lb

## 2018-10-30 DIAGNOSIS — C50412 Malignant neoplasm of upper-outer quadrant of left female breast: Secondary | ICD-10-CM

## 2018-10-30 DIAGNOSIS — Z170 Estrogen receptor positive status [ER+]: Secondary | ICD-10-CM

## 2018-10-30 DIAGNOSIS — Z79811 Long term (current) use of aromatase inhibitors: Secondary | ICD-10-CM | POA: Diagnosis not present
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
## 2018-10-30 NOTE — Telephone Encounter (Signed)
Gave avs and calendar ° °

## 2019-02-27 ENCOUNTER — Other Ambulatory Visit: Payer: Self-pay | Admitting: Family Medicine

## 2019-02-27 DIAGNOSIS — Z853 Personal history of malignant neoplasm of breast: Secondary | ICD-10-CM

## 2019-03-12 ENCOUNTER — Other Ambulatory Visit: Payer: Self-pay | Admitting: Adult Health

## 2019-03-12 DIAGNOSIS — Z853 Personal history of malignant neoplasm of breast: Secondary | ICD-10-CM

## 2019-03-14 ENCOUNTER — Ambulatory Visit
Admission: RE | Admit: 2019-03-14 | Discharge: 2019-03-14 | Disposition: A | Discharge status: Home / Self Care | Payer: Medicare Other | Source: Ambulatory Visit | Attending: Family Medicine | Admitting: Family Medicine

## 2019-03-14 ENCOUNTER — Other Ambulatory Visit: Payer: Self-pay

## 2019-03-14 DIAGNOSIS — Z853 Personal history of malignant neoplasm of breast: Secondary | ICD-10-CM

## 2019-03-19 ENCOUNTER — Telehealth: Payer: Self-pay | Admitting: Hematology and Oncology

## 2019-03-19 NOTE — Telephone Encounter (Signed)
I talk with patient regarding visit  °

## 2019-03-19 NOTE — Assessment & Plan Note (Signed)
Left lumpectomy: IDC grade 1, 1.5 cm, DCIS intermediate grade, negative for lymphovascular or perineural invasion, 1/3 lymph nodes positive with extranodal extension, ER 100%, PR 100%, HER-2 negative, Ki-67 10%, T1c and 1 a stage Ib Mammaprint low risk luminal type a Adjuvant radiation 06/06/2018 to 07/17/2018  Current treatment: Adjuvant antiestrogen therapy with letrozole 2.5 mg daily x7 years started 07/17/2018  Letrozole toxicities:  I renewed the prescription for another year Breast cancer surveillance: Mammogram 03/14/2019: Benign breast density category B  Return to clinic in 1 year for follow-up

## 2019-03-23 NOTE — Progress Notes (Signed)
HEMATOLOGY-ONCOLOGY TELEPHONE VISIT PROGRESS NOTE  I connected with Monica Abbott on 03/26/2019 at 10:00 AM EDT by telephone and verified that I am speaking with the correct person using two identifiers.  I discussed the limitations, risks, security and privacy concerns of performing an evaluation and management service by telephone and the availability of in person appointments.  I also discussed with the patient that there may be a patient responsible charge related to this service. The patient expressed understanding and agreed to proceed.   History of Present Illness: Monica Abbott is a 68 y.o. female with above-mentioned history of left breast cancer treated with lumpectomy, radiation, and who is currently on anti-estrogen therapy with letrozole. I last saw her 8 months ago. In the interim she was seen by Wilber Bihari, NP for Survivorship Clinic. Mammogram on 03/14/19 showed no evidence of malignancy. She is over the phone today for annual follow-up.   Oncology History  Malignant neoplasm of upper-outer quadrant of left breast in female, estrogen receptor positive ([place])  03/31/2018 Initial Diagnosis   Screening mammogram detected retroareolar solid and cystic mass at 1 o'clock position measuring 0.7 cm, adjacent smaller mass 0.4 cm not biopsied.  Biopsy of the retroareolar mass is grade 1 invasive ductal carcinoma ER 100%, PR 100%, Ki-67 10%, HER-2 negative, T1b N0 stage Ia AJCC 8   04/24/2018 Genetic Testing   PTCH1 c.324G>A VUS identified on the multi-cancer panel.  The Multi-Gene Panel offered by [facility] includes sequencing and/or deletion duplication testing of the following 84 genes: AIP, ALK, APC, ATM, AXIN2,BAP1,  BARD1, BLM, BMPR1A, BRCA1, BRCA2, BRIP1, CASR, CDC73, CDH1, CDK4, CDKN1B, CDKN1C, CDKN2A (p14ARF), CDKN2A (p16INK4a), CEBPA, CHEK2, CTNNA1, DICER1, DIS3L2, EGFR (c.2369C>T, p.Thr790Met variant only), EPCAM (Deletion/duplication testing only), FH, FLCN, GATA2, GPC3, GREM1  (Promoter region deletion/duplication testing only), HOXB13 (c.251G>A, p.Gly84Glu), HRAS, KIT, MAX, MEN1, MET, MITF (c.952G>A, p.Glu318Lys variant only), MLH1, MSH2, MSH3, MSH6, MUTYH, NBN, NF1, NF2, NTHL1, PALB2, PDGFRA, PHOX2B, PMS2, POLD1, POLE, POT1, PRKAR1A, PTCH1, PTEN, RAD50, RAD51C, RAD51D, RB1, RECQL4, RET, RUNX1, SDHAF2, SDHA (sequence changes only), SDHB, SDHC, SDHD, SMAD4, SMARCA4, SMARCB1, SMARCE1, STK11, SUFU, TERC, TERT, TMEM127, TP53, TSC1, TSC2, VHL, WRN and WT1.  The report date is April 24, 2018.   04/27/2018 Surgery   Left lumpectomy: IDC grade 1, 1.5 cm, DCIS intermediate grade, negative for lymphovascular or perineural invasion, 1/3 lymph nodes positive with extranodal extension, ER 100%, PR 100%, HER-2 negative, Ki-67 10%, T1cN1a stage Ib Mammaprint  Low risk   06/06/2018 - 07/17/2018 Radiation Therapy   Adjuvant radiation therapy   07/2018 -  Anti-estrogen oral therapy   Letrozole daily     Observations/Objective:  No evidence of disease recurrence   Assessment Plan:  Malignant neoplasm of upper-outer quadrant of left breast in female, estrogen receptor positive ([place]) Left lumpectomy: IDC grade 1, 1.5 cm, DCIS intermediate grade, negative for lymphovascular or perineural invasion, 1/3 lymph nodes positive with extranodal extension, ER 100%, PR 100%, HER-2 negative, Ki-67 10%, T1c and 1 a stage Ib Mammaprint low risk luminal type a Adjuvant radiation 06/06/2018 to 07/17/2018  Current treatment: Adjuvant antiestrogen therapy with letrozole 2.5 mg daily x7 years started 07/17/2018  Letrozole toxicities:  Decreased memory: Patient was an avid reader and now having some issues Muscle aches and pains: improved with activity  I renewed the prescription for another year Breast cancer surveillance: Mammogram 03/14/2019: Benign breast density category B  Return to clinic in 1 year for follow-up    I discussed the assessment and treatment plan  with the patient. The  patient was provided an opportunity to ask questions and all were answered. The patient agreed with the plan and demonstrated an understanding of the instructions. The patient was advised to call back or seek an in-person evaluation if the symptoms worsen or if the condition fails to improve as anticipated.   I provided 15 minutes of non-face-to-face time during this encounter.   Rulon Eisenmenger, MD 03/26/2019    I, Molly Dorshimer, am acting as scribe for Nicholas Lose, MD.  I have reviewed the above documentation for accuracy and completeness, and I agree with the above.

## 2019-03-26 ENCOUNTER — Telehealth: Payer: Self-pay | Admitting: Hematology and Oncology

## 2019-03-26 ENCOUNTER — Inpatient Hospital Stay: Payer: Medicare Other | Attending: Hematology and Oncology | Admitting: Hematology and Oncology

## 2019-03-26 DIAGNOSIS — C50412 Malignant neoplasm of upper-outer quadrant of left female breast: Secondary | ICD-10-CM | POA: Diagnosis not present

## 2019-03-26 DIAGNOSIS — Z170 Estrogen receptor positive status [ER+]: Secondary | ICD-10-CM

## 2019-03-26 MED ORDER — LETROZOLE 2.5 MG PO TABS: 2.5000 mg | ORAL_TABLET | Freq: Every day | ORAL | 3 refills | Status: DC

## 2019-03-26 NOTE — Telephone Encounter (Signed)
Scheduled per 03/26/19 los, patient is notified and calender will be mailed.

## 2019-04-14 IMAGING — MG BREAST SURGICAL SPECIMEN
1 series · 1 of 1 positions shown · non-contrast
Comparison: Previous exam(s).

CLINICAL DATA: Post left breast lumpectomy.

EXAM:
SPECIMEN RADIOGRAPH OF THE LEFT BREAST

[L]
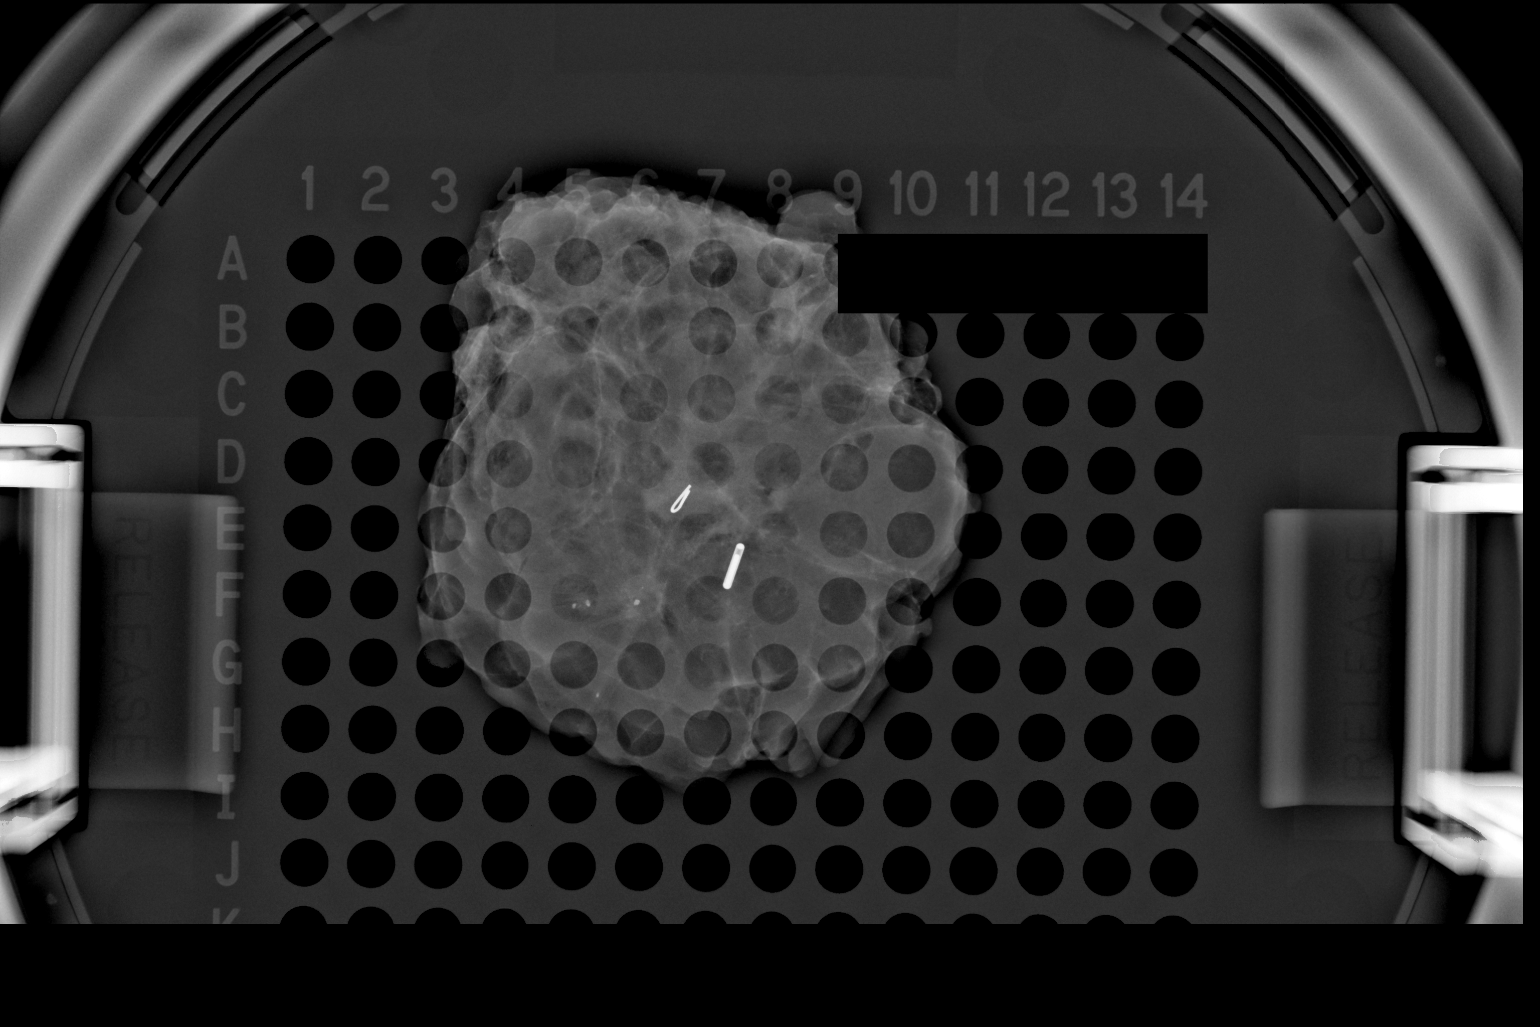

[1 of 1 positions shown; findings below may reference images not displayed]

FINDINGS: Status post excision of the left breast. The radioactive seed and
biopsy marker clip are present, completely intact, and were marked
for pathology.
IMPRESSION: Specimen radiograph of the left breast.

## 2019-05-14 ENCOUNTER — Encounter: Payer: Self-pay | Admitting: Genetic Counselor

## 2020-03-12 ENCOUNTER — Other Ambulatory Visit: Payer: Self-pay | Admitting: Hematology and Oncology

## 2020-03-12 DIAGNOSIS — Z9889 Other specified postprocedural states: Secondary | ICD-10-CM

## 2020-03-12 DIAGNOSIS — Z853 Personal history of malignant neoplasm of breast: Secondary | ICD-10-CM

## 2020-03-24 NOTE — Progress Notes (Signed)
Patient Care Team: Claudine Mouton, FNP as PCP - General (Family Medicine) Erroll Luna, MD as Consulting Physician (General Surgery) Nicholas Lose, MD as Consulting Physician (Hematology and Oncology) Kyung Rudd, MD as Consulting Physician (Radiation Oncology)  DIAGNOSIS:    ICD-10-CM   1. Malignant neoplasm of upper-outer quadrant of left breast in female, estrogen receptor positive ([place])  C50.412    Z17.0     SUMMARY OF ONCOLOGIC HISTORY: Oncology History  Malignant neoplasm of upper-outer quadrant of left breast in female, estrogen receptor positive ([place])  03/31/2018 Initial Diagnosis   Screening mammogram detected retroareolar solid and cystic mass at 1 o'clock position measuring 0.7 cm, adjacent smaller mass 0.4 cm not biopsied.  Biopsy of the retroareolar mass is grade 1 invasive ductal carcinoma ER 100%, PR 100%, Ki-67 10%, HER-2 negative, T1b N0 stage Ia AJCC 8   04/24/2018 Genetic Testing   PTCH1 c.324G>A VUS identified on the multi-cancer panel.  The Multi-Gene Panel offered by [facility] includes sequencing and/or deletion duplication testing of the following 84 genes: AIP, ALK, APC, ATM, AXIN2,BAP1,  BARD1, BLM, BMPR1A, BRCA1, BRCA2, BRIP1, CASR, CDC73, CDH1, CDK4, CDKN1B, CDKN1C, CDKN2A (p14ARF), CDKN2A (p16INK4a), CEBPA, CHEK2, CTNNA1, DICER1, DIS3L2, EGFR (c.2369C>T, p.Thr790Met variant only), EPCAM (Deletion/duplication testing only), FH, FLCN, GATA2, GPC3, GREM1 (Promoter region deletion/duplication testing only), HOXB13 (c.251G>A, p.Gly84Glu), HRAS, KIT, MAX, MEN1, MET, MITF (c.952G>A, p.Glu318Lys variant only), MLH1, MSH2, MSH3, MSH6, MUTYH, NBN, NF1, NF2, NTHL1, PALB2, PDGFRA, PHOX2B, PMS2, POLD1, POLE, POT1, PRKAR1A, PTCH1, PTEN, RAD50, RAD51C, RAD51D, RB1, RECQL4, RET, RUNX1, SDHAF2, SDHA (sequence changes only), SDHB, SDHC, SDHD, SMAD4, SMARCA4, SMARCB1, SMARCE1, STK11, SUFU, TERC, TERT, TMEM127, TP53, TSC1, TSC2, VHL, WRN and WT1.  The report date is April 24, 2018.   04/27/2018 Surgery   Left lumpectomy: IDC grade 1, 1.5 cm, DCIS intermediate grade, negative for lymphovascular or perineural invasion, 1/3 lymph nodes positive with extranodal extension, ER 100%, PR 100%, HER-2 negative, Ki-67 10%, T1cN1a stage Ib Mammaprint  Low risk   06/06/2018 - 07/17/2018 Radiation Therapy   Adjuvant radiation therapy   07/2018 -  Anti-estrogen oral therapy   Letrozole daily     CHIEF COMPLIANT: Follow-up of left breast cancer on letrozole   INTERVAL HISTORY: Monica Abbott is a 69 y.o. with above-mentioned history of left breast cancer treated with lumpectomy, radiation, and who is currently on anti-estrogen therapy with letrozole. She presents to the clinic today for annual follow-up.   She reports to be tolerating letrozole fairly well.  She does have muscle aches and stiffness but no different than before.  Denies any  ALLERGIES:  has No Known Allergies.  MEDICATIONS:  Current Outpatient Medications  Medication Sig Dispense Refill  . atorvastatin (LIPITOR) 10 MG tablet Take 10 mg by mouth daily.    . Cholecalciferol (VITAMIN D PO) Take 25 mcg by mouth daily.    Marland Kitchen letrozole (FEMARA) 2.5 MG tablet Take 1 tablet (2.5 mg total) by mouth daily. 90 tablet 3  . lisinopril (PRINIVIL,ZESTRIL) 10 MG tablet Take 10 mg by mouth daily.    . meloxicam (MOBIC) 15 MG tablet Take 15 mg by mouth daily.    . Multiple Vitamin (MULTIVITAMIN WITH MINERALS) TABS tablet Take 1 tablet by mouth daily.    . Omega-3 Fatty Acids (FISH OIL) 1000 MG CPDR Take 1,000 mg by mouth daily.    Marland Kitchen oxybutynin (DITROPAN) 5 MG tablet Take 5 mg by mouth 2 (two) times daily.     No current facility-administered medications for  this visit.    PHYSICAL EXAMINATION: ECOG PERFORMANCE STATUS: 1 - Symptomatic but completely ambulatory  Vitals:   03/25/20 1010  BP: (!) 159/73  Pulse: (!) 57  Resp: 20  Temp: 98.3 F (36.8 C)  SpO2: 100%   Filed Weights   03/25/20 1010  Weight: 178 lb  3.2 oz (80.8 kg)    BREAST: No palpable masses or nodules in either right or left breasts. No palpable axillary supraclavicular or infraclavicular adenopathy no breast tenderness or nipple discharge. (exam performed in the presence of a chaperone)  LABORATORY DATA:  I have reviewed the data as listed CMP Latest Ref Rng & Units 04/12/2018  Glucose 70 - 99 mg/dL 106(H)  BUN 8 - 23 mg/dL 17  Creatinine 0.44 - 1.00 mg/dL 0.99  Sodium 135 - 145 mmol/L 142  Potassium 3.5 - 5.1 mmol/L 4.5  Chloride 98 - 111 mmol/L 107  CO2 22 - 32 mmol/L 27  Calcium 8.9 - 10.3 mg/dL 9.8  Total Protein 6.5 - 8.1 g/dL 7.4  Total Bilirubin 0.3 - 1.2 mg/dL 0.4  Alkaline Phos 38 - 126 U/L 76  AST 15 - 41 U/L 20  ALT 0 - 44 U/L 20    Lab Results  Component Value Date   WBC 4.6 04/12/2018   HGB 13.9 04/12/2018   HCT 40.5 04/12/2018   MCV 86.4 04/12/2018   PLT 210 04/12/2018   NEUTROABS 2.7 04/12/2018    ASSESSMENT & PLAN:  Malignant neoplasm of upper-outer quadrant of left breast in female, estrogen receptor positive ([place]) Left lumpectomy: IDC grade 1, 1.5 cm, DCIS intermediate grade, negative for lymphovascular or perineural invasion, 1/3 lymph nodes positive with extranodal extension, ER 100%, PR 100%, HER-2 negative, Ki-67 10%, T1c and 1 a stage Ib Mammaprint low risk luminal type a Adjuvant radiation10/15/2019 to 07/17/2018  Current treatment: Adjuvant antiestrogen therapywith letrozole 2.5 mg daily x7 years started 07/17/2018  Letrozole toxicities:  Decreased memory: Patient was an avid reader  Muscle aches and pains: improved with activity  I renewed the prescription for another year  Breast cancer surveillance:  Mammogram 03/14/2019: Benign breast density category B, Next mammogram scheduled for 04/01/2020 Breast exam 03/25/2020: Benign  Return to clinic in 1 year for follow-up    No orders of the defined types were placed in this encounter.  The patient has a good understanding of  the overall plan. she agrees with it. she will call with any problems that may develop before the next visit here.  Total time spent: 20 mins including face to face time and time spent for planning, charting and coordination of care   I, Molly Dorshimer, am acting as scribe for Dr. Nicholas Lose.  I have reviewed the above documentation for accuracy and completeness, and I agree with the above.

## 2020-03-25 ENCOUNTER — Other Ambulatory Visit: Payer: Self-pay

## 2020-03-25 ENCOUNTER — Ambulatory Visit: Payer: Medicare Other | Admitting: Hematology and Oncology

## 2020-03-25 ENCOUNTER — Telehealth: Payer: Self-pay | Admitting: Hematology and Oncology

## 2020-03-25 ENCOUNTER — Inpatient Hospital Stay: Payer: Medicare PPO | Attending: Hematology and Oncology | Admitting: Hematology and Oncology

## 2020-03-25 DIAGNOSIS — Z170 Estrogen receptor positive status [ER+]: Secondary | ICD-10-CM

## 2020-03-25 DIAGNOSIS — Z923 Personal history of irradiation: Secondary | ICD-10-CM | POA: Diagnosis not present

## 2020-03-25 DIAGNOSIS — Z79899 Other long term (current) drug therapy: Secondary | ICD-10-CM | POA: Insufficient documentation

## 2020-03-25 DIAGNOSIS — C50412 Malignant neoplasm of upper-outer quadrant of left female breast: Secondary | ICD-10-CM | POA: Diagnosis present

## 2020-03-25 DIAGNOSIS — Z79811 Long term (current) use of aromatase inhibitors: Secondary | ICD-10-CM | POA: Diagnosis not present

## 2020-03-25 MED ORDER — LETROZOLE 2.5 MG PO TABS: 2.5000 mg | ORAL_TABLET | Freq: Every day | ORAL | 3 refills | Status: DC

## 2020-03-25 NOTE — Telephone Encounter (Signed)
Scheduled appts per 8/3 los. Gave pt a print out of AVS.  

## 2020-03-25 NOTE — Assessment & Plan Note (Signed)
Left lumpectomy: IDC grade 1, 1.5 cm, DCIS intermediate grade, negative for lymphovascular or perineural invasion, 1/3 lymph nodes positive with extranodal extension, ER 100%, PR 100%, HER-2 negative, Ki-67 10%, T1c and 1 a stage Ib Mammaprint low risk luminal type a Adjuvant radiation10/15/2019 to 07/17/2018  Current treatment: Adjuvant antiestrogen therapywith letrozole 2.5 mg daily x7 years started 07/17/2018  Letrozole toxicities:  Decreased memory: Patient was an avid reader and now having some issues Muscle aches and pains: improved with activity  I renewed the prescription for another year  Breast cancer surveillance:  Mammogram 03/14/2019: Benign breast density category B, Next mammogram scheduled for 04/01/2020 Breast exam 03/25/2020: Benign  Return to clinic in 1 year for follow-up

## 2020-04-01 ENCOUNTER — Telehealth: Payer: Self-pay | Admitting: Hematology and Oncology

## 2020-04-01 ENCOUNTER — Ambulatory Visit
Admission: RE | Admit: 2020-04-01 | Discharge: 2020-04-01 | Disposition: A | Discharge status: Home / Self Care | Payer: Medicare PPO | Source: Ambulatory Visit | Attending: Hematology and Oncology | Admitting: Hematology and Oncology

## 2020-04-01 ENCOUNTER — Other Ambulatory Visit: Payer: Self-pay

## 2020-04-01 DIAGNOSIS — Z853 Personal history of malignant neoplasm of breast: Secondary | ICD-10-CM

## 2020-04-01 DIAGNOSIS — Z9889 Other specified postprocedural states: Secondary | ICD-10-CM

## 2020-04-01 NOTE — Telephone Encounter (Signed)
Rescheduled appointment per 8/5 provider message. Left message with updated appointment date and time.

## 2020-06-26 DIAGNOSIS — S52502D Unspecified fracture of the lower end of left radius, subsequent encounter for closed fracture with routine healing: Secondary | ICD-10-CM | POA: Diagnosis not present

## 2020-06-26 DIAGNOSIS — S52572A Other intraarticular fracture of lower end of left radius, initial encounter for closed fracture: Secondary | ICD-10-CM | POA: Diagnosis not present

## 2020-07-02 DIAGNOSIS — Z7189 Other specified counseling: Secondary | ICD-10-CM | POA: Diagnosis not present

## 2020-07-02 DIAGNOSIS — Z23 Encounter for immunization: Secondary | ICD-10-CM | POA: Diagnosis not present

## 2020-07-07 DIAGNOSIS — E559 Vitamin D deficiency, unspecified: Secondary | ICD-10-CM | POA: Diagnosis not present

## 2020-07-07 DIAGNOSIS — I10 Essential (primary) hypertension: Secondary | ICD-10-CM | POA: Diagnosis not present

## 2020-07-07 DIAGNOSIS — E785 Hyperlipidemia, unspecified: Secondary | ICD-10-CM | POA: Diagnosis not present

## 2020-07-14 DIAGNOSIS — I10 Essential (primary) hypertension: Secondary | ICD-10-CM | POA: Diagnosis not present

## 2020-07-14 DIAGNOSIS — E559 Vitamin D deficiency, unspecified: Secondary | ICD-10-CM | POA: Diagnosis not present

## 2020-07-14 DIAGNOSIS — Z6830 Body mass index (BMI) 30.0-30.9, adult: Secondary | ICD-10-CM | POA: Diagnosis not present

## 2020-07-14 DIAGNOSIS — Z0000 Encounter for general adult medical examination without abnormal findings: Secondary | ICD-10-CM | POA: Diagnosis not present

## 2020-07-14 DIAGNOSIS — E785 Hyperlipidemia, unspecified: Secondary | ICD-10-CM | POA: Diagnosis not present

## 2020-07-14 DIAGNOSIS — C50112 Malignant neoplasm of central portion of left female breast: Secondary | ICD-10-CM | POA: Diagnosis not present

## 2020-07-24 DIAGNOSIS — S52502D Unspecified fracture of the lower end of left radius, subsequent encounter for closed fracture with routine healing: Secondary | ICD-10-CM | POA: Diagnosis not present

## 2020-07-24 DIAGNOSIS — M19032 Primary osteoarthritis, left wrist: Secondary | ICD-10-CM | POA: Diagnosis not present

## 2020-07-24 DIAGNOSIS — M1812 Unilateral primary osteoarthritis of first carpometacarpal joint, left hand: Secondary | ICD-10-CM | POA: Diagnosis not present

## 2020-07-24 DIAGNOSIS — M25532 Pain in left wrist: Secondary | ICD-10-CM | POA: Diagnosis not present

## 2020-07-29 DIAGNOSIS — B029 Zoster without complications: Secondary | ICD-10-CM | POA: Diagnosis not present

## 2020-07-29 DIAGNOSIS — R21 Rash and other nonspecific skin eruption: Secondary | ICD-10-CM | POA: Diagnosis not present

## 2020-08-06 DIAGNOSIS — S52572D Other intraarticular fracture of lower end of left radius, subsequent encounter for closed fracture with routine healing: Secondary | ICD-10-CM | POA: Diagnosis not present

## 2020-08-06 DIAGNOSIS — R29898 Other symptoms and signs involving the musculoskeletal system: Secondary | ICD-10-CM | POA: Diagnosis not present

## 2020-08-21 DIAGNOSIS — S52572A Other intraarticular fracture of lower end of left radius, initial encounter for closed fracture: Secondary | ICD-10-CM | POA: Diagnosis not present

## 2020-08-21 DIAGNOSIS — S52502A Unspecified fracture of the lower end of left radius, initial encounter for closed fracture: Secondary | ICD-10-CM | POA: Diagnosis not present

## 2020-08-21 DIAGNOSIS — M1812 Unilateral primary osteoarthritis of first carpometacarpal joint, left hand: Secondary | ICD-10-CM | POA: Diagnosis not present

## 2020-08-29 DIAGNOSIS — S52572D Other intraarticular fracture of lower end of left radius, subsequent encounter for closed fracture with routine healing: Secondary | ICD-10-CM | POA: Diagnosis not present

## 2020-08-29 DIAGNOSIS — R29898 Other symptoms and signs involving the musculoskeletal system: Secondary | ICD-10-CM | POA: Diagnosis not present

## 2020-09-03 DIAGNOSIS — R29898 Other symptoms and signs involving the musculoskeletal system: Secondary | ICD-10-CM | POA: Diagnosis not present

## 2020-09-03 DIAGNOSIS — S52572D Other intraarticular fracture of lower end of left radius, subsequent encounter for closed fracture with routine healing: Secondary | ICD-10-CM | POA: Diagnosis not present

## 2020-09-17 DIAGNOSIS — S52572D Other intraarticular fracture of lower end of left radius, subsequent encounter for closed fracture with routine healing: Secondary | ICD-10-CM | POA: Diagnosis not present

## 2020-09-17 DIAGNOSIS — R29898 Other symptoms and signs involving the musculoskeletal system: Secondary | ICD-10-CM | POA: Diagnosis not present

## 2021-01-12 DIAGNOSIS — I10 Essential (primary) hypertension: Secondary | ICD-10-CM | POA: Diagnosis not present

## 2021-01-12 DIAGNOSIS — Z1231 Encounter for screening mammogram for malignant neoplasm of breast: Secondary | ICD-10-CM | POA: Diagnosis not present

## 2021-01-12 DIAGNOSIS — Z1211 Encounter for screening for malignant neoplasm of colon: Secondary | ICD-10-CM | POA: Diagnosis not present

## 2021-01-12 DIAGNOSIS — C50112 Malignant neoplasm of central portion of left female breast: Secondary | ICD-10-CM | POA: Diagnosis not present

## 2021-01-12 DIAGNOSIS — Z6830 Body mass index (BMI) 30.0-30.9, adult: Secondary | ICD-10-CM | POA: Diagnosis not present

## 2021-01-12 DIAGNOSIS — E785 Hyperlipidemia, unspecified: Secondary | ICD-10-CM | POA: Diagnosis not present

## 2021-01-12 DIAGNOSIS — R220 Localized swelling, mass and lump, head: Secondary | ICD-10-CM | POA: Diagnosis not present

## 2021-01-12 DIAGNOSIS — E559 Vitamin D deficiency, unspecified: Secondary | ICD-10-CM | POA: Diagnosis not present

## 2021-02-12 DIAGNOSIS — H40033 Anatomical narrow angle, bilateral: Secondary | ICD-10-CM | POA: Diagnosis not present

## 2021-02-12 DIAGNOSIS — H2513 Age-related nuclear cataract, bilateral: Secondary | ICD-10-CM | POA: Diagnosis not present

## 2021-02-12 DIAGNOSIS — D3131 Benign neoplasm of right choroid: Secondary | ICD-10-CM | POA: Diagnosis not present

## 2021-02-27 ENCOUNTER — Other Ambulatory Visit: Payer: Self-pay | Admitting: Hematology and Oncology

## 2021-02-27 DIAGNOSIS — Z853 Personal history of malignant neoplasm of breast: Secondary | ICD-10-CM

## 2021-03-19 ENCOUNTER — Other Ambulatory Visit: Payer: Self-pay | Admitting: Hematology and Oncology

## 2021-03-25 ENCOUNTER — Ambulatory Visit: Payer: Medicare PPO | Admitting: Hematology and Oncology

## 2021-03-26 ENCOUNTER — Inpatient Hospital Stay: Payer: Medicare PPO | Admitting: Hematology and Oncology

## 2021-04-07 DIAGNOSIS — N390 Urinary tract infection, site not specified: Secondary | ICD-10-CM | POA: Diagnosis not present

## 2021-04-13 ENCOUNTER — Other Ambulatory Visit: Payer: Self-pay | Admitting: Hematology and Oncology

## 2021-04-13 ENCOUNTER — Other Ambulatory Visit: Payer: Self-pay

## 2021-04-13 ENCOUNTER — Ambulatory Visit
Admission: RE | Admit: 2021-04-13 | Discharge: 2021-04-13 | Disposition: A | Discharge status: Home / Self Care | Payer: Medicare PPO | Source: Ambulatory Visit | Attending: Hematology and Oncology | Admitting: Hematology and Oncology

## 2021-04-13 DIAGNOSIS — N6001 Solitary cyst of right breast: Secondary | ICD-10-CM | POA: Diagnosis not present

## 2021-04-13 DIAGNOSIS — R922 Inconclusive mammogram: Secondary | ICD-10-CM | POA: Diagnosis not present

## 2021-04-13 DIAGNOSIS — R928 Other abnormal and inconclusive findings on diagnostic imaging of breast: Secondary | ICD-10-CM

## 2021-04-13 DIAGNOSIS — Z853 Personal history of malignant neoplasm of breast: Secondary | ICD-10-CM

## 2021-04-13 HISTORY — DX: Personal history of irradiation: Z92.3

## 2021-04-14 NOTE — Progress Notes (Signed)
Patient Care Team: Claudine Mouton, FNP as PCP - General (Family Medicine) Erroll Luna, MD as Consulting Physician (General Surgery) Nicholas Lose, MD as Consulting Physician (Hematology and Oncology) Kyung Rudd, MD as Consulting Physician (Radiation Oncology)  DIAGNOSIS:    ICD-10-CM   1. Malignant neoplasm of upper-outer quadrant of left breast in female, estrogen receptor positive ([place])  C50.412    Z17.0       SUMMARY OF ONCOLOGIC HISTORY: Oncology History  Malignant neoplasm of upper-outer quadrant of left breast in female, estrogen receptor positive ([place])  03/31/2018 Initial Diagnosis   Screening mammogram detected retroareolar solid and cystic mass at 1 o'clock position measuring 0.7 cm, adjacent smaller mass 0.4 cm not biopsied.  Biopsy of the retroareolar mass is grade 1 invasive ductal carcinoma ER 100%, PR 100%, Ki-67 10%, HER-2 negative, T1b N0 stage Ia AJCC 8   04/24/2018 Genetic Testing   PTCH1 c.324G>A VUS identified on the multi-cancer panel.  The Multi-Gene Panel offered by [facility] includes sequencing and/or deletion duplication testing of the following 84 genes: AIP, ALK, APC, ATM, AXIN2,BAP1,  BARD1, BLM, BMPR1A, BRCA1, BRCA2, BRIP1, CASR, CDC73, CDH1, CDK4, CDKN1B, CDKN1C, CDKN2A (p14ARF), CDKN2A (p16INK4a), CEBPA, CHEK2, CTNNA1, DICER1, DIS3L2, EGFR (c.2369C>T, p.Thr790Met variant only), EPCAM (Deletion/duplication testing only), FH, FLCN, GATA2, GPC3, GREM1 (Promoter region deletion/duplication testing only), HOXB13 (c.251G>A, p.Gly84Glu), HRAS, KIT, MAX, MEN1, MET, MITF (c.952G>A, p.Glu318Lys variant only), MLH1, MSH2, MSH3, MSH6, MUTYH, NBN, NF1, NF2, NTHL1, PALB2, PDGFRA, PHOX2B, PMS2, POLD1, POLE, POT1, PRKAR1A, PTCH1, PTEN, RAD50, RAD51C, RAD51D, RB1, RECQL4, RET, RUNX1, SDHAF2, SDHA (sequence changes only), SDHB, SDHC, SDHD, SMAD4, SMARCA4, SMARCB1, SMARCE1, STK11, SUFU, TERC, TERT, TMEM127, TP53, TSC1, TSC2, VHL, WRN and WT1.  The report date is April 24, 2018.   04/27/2018 Surgery   Left lumpectomy: IDC grade 1, 1.5 cm, DCIS intermediate grade, negative for lymphovascular or perineural invasion, 1/3 lymph nodes positive with extranodal extension, ER 100%, PR 100%, HER-2 negative, Ki-67 10%, T1cN1a stage Ib Mammaprint  Low risk   06/06/2018 - 07/17/2018 Radiation Therapy   Adjuvant radiation therapy   07/2018 -  Anti-estrogen oral therapy   Letrozole daily     CHIEF COMPLIANT: Follow-up of left breast cancer on letrozole   INTERVAL HISTORY: Monica Abbott is a 70 y.o. with above-mentioned history of left breast cancer treated with lumpectomy, radiation, and who is currently on anti-estrogen therapy with letrozole. Mammogram on 04/13/21 showed no evidence of malignancy. She presents to the clinic today for annual follow-up.   ALLERGIES:  has No Known Allergies.  MEDICATIONS:  Current Outpatient Medications  Medication Sig Dispense Refill   atorvastatin (LIPITOR) 10 MG tablet Take 10 mg by mouth daily.     Cholecalciferol (VITAMIN D PO) Take 25 mcg by mouth daily.     letrozole (FEMARA) 2.5 MG tablet TAKE 1 TABLET BY MOUTH EVERY DAY 90 tablet 3   lisinopril (PRINIVIL,ZESTRIL) 10 MG tablet Take 10 mg by mouth daily.     meloxicam (MOBIC) 15 MG tablet Take 15 mg by mouth daily.     Multiple Vitamin (MULTIVITAMIN WITH MINERALS) TABS tablet Take 1 tablet by mouth daily.     Omega-3 Fatty Acids (FISH OIL) 1000 MG CPDR Take 1,000 mg by mouth daily.     oxybutynin (DITROPAN) 5 MG tablet Take 5 mg by mouth 2 (two) times daily.     No current facility-administered medications for this visit.    PHYSICAL EXAMINATION: ECOG PERFORMANCE STATUS: 1 - Symptomatic but completely ambulatory  Vitals:   04/15/21 1110  BP: (!) 163/74  Pulse: 69  Resp: 18  Temp: (!) 97.5 F (36.4 C)  SpO2: 99%   Filed Weights   04/15/21 1110  Weight: 177 lb (80.3 kg)    BREAST: No palpable masses or nodules in either right or left breasts. No palpable  axillary supraclavicular or infraclavicular adenopathy no breast tenderness or nipple discharge. (exam performed in the presence of a chaperone)  LABORATORY DATA:  I have reviewed the data as listed CMP Latest Ref Rng & Units 04/12/2018  Glucose 70 - 99 mg/dL 106(H)  BUN 8 - 23 mg/dL 17  Creatinine 0.44 - 1.00 mg/dL 0.99  Sodium 135 - 145 mmol/L 142  Potassium 3.5 - 5.1 mmol/L 4.5  Chloride 98 - 111 mmol/L 107  CO2 22 - 32 mmol/L 27  Calcium 8.9 - 10.3 mg/dL 9.8  Total Protein 6.5 - 8.1 g/dL 7.4  Total Bilirubin 0.3 - 1.2 mg/dL 0.4  Alkaline Phos 38 - 126 U/L 76  AST 15 - 41 U/L 20  ALT 0 - 44 U/L 20    Lab Results  Component Value Date   WBC 4.6 04/12/2018   HGB 13.9 04/12/2018   HCT 40.5 04/12/2018   MCV 86.4 04/12/2018   PLT 210 04/12/2018   NEUTROABS 2.7 04/12/2018    ASSESSMENT & PLAN:  Malignant neoplasm of upper-outer quadrant of left breast in female, estrogen receptor positive ([place]) Left lumpectomy: IDC grade 1, 1.5 cm, DCIS intermediate grade, negative for lymphovascular or perineural invasion, 1/3 lymph nodes positive with extranodal extension, ER 100%, PR 100%, HER-2 negative, Ki-67 10%, T1c and 1 a stage Ib Mammaprint low risk luminal type a Adjuvant radiation 06/06/2018 to 07/17/2018   Current treatment: Adjuvant antiestrogen therapy with letrozole 2.5 mg daily x7 years started 07/17/2018   Letrozole toxicities:  Decreased memory: Patient was an avid reader  Muscle aches and pains: improved with activity   I renewed the prescription for another year   Breast cancer surveillance:  Mammogram and ultrasound 04/13/2021: Benign breast density category B  Breast exam 04/15/2021: Benign   Return to clinic in 1 year for follow-up      No orders of the defined types were placed in this encounter.  The patient has a good understanding of the overall plan. she agrees with it. she will call with any problems that may develop before the next visit here.  Total  time spent: 20 mins including face to face time and time spent for planning, charting and coordination of care  Rulon Eisenmenger, MD, MPH 04/15/2021  I, Thana Ates, am acting as scribe for Dr. Nicholas Lose.  I have reviewed the above documentation for accuracy and completeness, and I agree with the above.

## 2021-04-14 NOTE — Assessment & Plan Note (Signed)
Left lumpectomy: IDC grade 1, 1.5 cm, DCIS intermediate grade, negative for lymphovascular or perineural invasion, 1/3 lymph nodes positive with extranodal extension, ER 100%, PR 100%, HER-2 negative, Ki-67 10%, T1c and 1 a stage Ib Mammaprint low risk luminal type a Adjuvant radiation10/15/2019 to 07/17/2018  Current treatment:Adjuvant antiestrogen therapywith letrozole 2.5 mg daily x7 yearsstarted 07/17/2018  Letrozoletoxicities: Decreased memory: Patient was an avid reader  Muscle aches and pains: improved with activity  I renewed the prescription for another year  Breast cancer surveillance: Mammogram and ultrasound 04/13/2021: Benign breast density category B  Breast exam 04/15/2021: Benign  Return to clinic in 1 year for follow-up

## 2021-04-15 ENCOUNTER — Emergency Department (HOSPITAL_COMMUNITY)
Admission: EM | Admit: 2021-04-15 | Discharge: 2021-04-15 | Disposition: A | Discharge status: Home / Self Care | Payer: Medicare PPO | Attending: Emergency Medicine | Admitting: Emergency Medicine

## 2021-04-15 ENCOUNTER — Emergency Department (HOSPITAL_COMMUNITY): Payer: Medicare PPO

## 2021-04-15 ENCOUNTER — Encounter (HOSPITAL_COMMUNITY): Payer: Self-pay

## 2021-04-15 ENCOUNTER — Other Ambulatory Visit: Payer: Self-pay

## 2021-04-15 ENCOUNTER — Inpatient Hospital Stay: Payer: Medicare PPO | Attending: Hematology and Oncology | Admitting: Hematology and Oncology

## 2021-04-15 DIAGNOSIS — I10 Essential (primary) hypertension: Secondary | ICD-10-CM | POA: Diagnosis not present

## 2021-04-15 DIAGNOSIS — C50412 Malignant neoplasm of upper-outer quadrant of left female breast: Secondary | ICD-10-CM | POA: Diagnosis not present

## 2021-04-15 DIAGNOSIS — Z170 Estrogen receptor positive status [ER+]: Secondary | ICD-10-CM | POA: Diagnosis not present

## 2021-04-15 DIAGNOSIS — Z923 Personal history of irradiation: Secondary | ICD-10-CM | POA: Diagnosis not present

## 2021-04-15 DIAGNOSIS — R42 Dizziness and giddiness: Secondary | ICD-10-CM | POA: Insufficient documentation

## 2021-04-15 DIAGNOSIS — R110 Nausea: Secondary | ICD-10-CM | POA: Insufficient documentation

## 2021-04-15 DIAGNOSIS — Z853 Personal history of malignant neoplasm of breast: Secondary | ICD-10-CM | POA: Insufficient documentation

## 2021-04-15 DIAGNOSIS — R232 Flushing: Secondary | ICD-10-CM | POA: Insufficient documentation

## 2021-04-15 DIAGNOSIS — R231 Pallor: Secondary | ICD-10-CM | POA: Diagnosis not present

## 2021-04-15 DIAGNOSIS — R0902 Hypoxemia: Secondary | ICD-10-CM | POA: Diagnosis not present

## 2021-04-15 DIAGNOSIS — Z79899 Other long term (current) drug therapy: Secondary | ICD-10-CM | POA: Diagnosis not present

## 2021-04-15 DIAGNOSIS — R55 Syncope and collapse: Secondary | ICD-10-CM | POA: Insufficient documentation

## 2021-04-15 DIAGNOSIS — I959 Hypotension, unspecified: Secondary | ICD-10-CM | POA: Diagnosis not present

## 2021-04-15 LAB — URINALYSIS, ROUTINE W REFLEX MICROSCOPIC
Bilirubin Urine: NEGATIVE
Glucose, UA: NEGATIVE mg/dL
Hgb urine dipstick: NEGATIVE
Ketones, ur: 5 mg/dL — AB
Leukocytes,Ua: NEGATIVE
Nitrite: NEGATIVE
Protein, ur: NEGATIVE mg/dL
Specific Gravity, Urine: 1.015 (ref 1.005–1.030)
pH: 5 (ref 5.0–8.0)

## 2021-04-15 LAB — CBC WITH DIFFERENTIAL/PLATELET
Abs Immature Granulocytes: 0.03 10*3/uL (ref 0.00–0.07)
Basophils Absolute: 0.1 10*3/uL (ref 0.0–0.1)
Basophils Relative: 1 %
Eosinophils Absolute: 0.1 10*3/uL (ref 0.0–0.5)
Eosinophils Relative: 1 %
HCT: 46.3 % — ABNORMAL HIGH (ref 36.0–46.0)
Hemoglobin: 15.8 g/dL — ABNORMAL HIGH (ref 12.0–15.0)
Immature Granulocytes: 0 %
Lymphocytes Relative: 9 %
Lymphs Abs: 0.9 10*3/uL (ref 0.7–4.0)
MCH: 30.2 pg (ref 26.0–34.0)
MCHC: 34.1 g/dL (ref 30.0–36.0)
MCV: 88.5 fL (ref 80.0–100.0)
Monocytes Absolute: 0.6 10*3/uL (ref 0.1–1.0)
Monocytes Relative: 6 %
Neutro Abs: 8.6 10*3/uL — ABNORMAL HIGH (ref 1.7–7.7)
Neutrophils Relative %: 83 %
Platelets: 292 10*3/uL (ref 150–400)
RBC: 5.23 MIL/uL — ABNORMAL HIGH (ref 3.87–5.11)
RDW: 12.5 % (ref 11.5–15.5)
WBC: 10.2 10*3/uL (ref 4.0–10.5)
nRBC: 0 % (ref 0.0–0.2)

## 2021-04-15 LAB — COMPREHENSIVE METABOLIC PANEL
ALT: 22 U/L (ref 0–44)
AST: 26 U/L (ref 15–41)
Albumin: 5 g/dL (ref 3.5–5.0)
Alkaline Phosphatase: 83 U/L (ref 38–126)
Anion gap: 8 (ref 5–15)
BUN: 24 mg/dL — ABNORMAL HIGH (ref 8–23)
CO2: 29 mmol/L (ref 22–32)
Calcium: 10.6 mg/dL — ABNORMAL HIGH (ref 8.9–10.3)
Chloride: 107 mmol/L (ref 98–111)
Creatinine, Ser: 1.21 mg/dL — ABNORMAL HIGH (ref 0.44–1.00)
GFR, Estimated: 49 mL/min — ABNORMAL LOW (ref >60)
Glucose, Bld: 194 mg/dL — ABNORMAL HIGH (ref 70–99)
Potassium: 4.1 mmol/L (ref 3.5–5.1)
Sodium: 144 mmol/L (ref 135–145)
Total Bilirubin: 0.7 mg/dL (ref 0.3–1.2)
Total Protein: 8.6 g/dL — ABNORMAL HIGH (ref 6.5–8.1)

## 2021-04-15 LAB — I-STAT CHEM 8, ED
BUN: 23 mg/dL (ref 8–23)
Calcium, Ion: 1.34 mmol/L (ref 1.15–1.40)
Chloride: 104 mmol/L (ref 98–111)
Creatinine, Ser: 1.2 mg/dL — ABNORMAL HIGH (ref 0.44–1.00)
Glucose, Bld: 194 mg/dL — ABNORMAL HIGH (ref 70–99)
HCT: 45 % (ref 36.0–46.0)
Hemoglobin: 15.3 g/dL — ABNORMAL HIGH (ref 12.0–15.0)
Potassium: 4 mmol/L (ref 3.5–5.1)
Sodium: 143 mmol/L (ref 135–145)
TCO2: 30 mmol/L (ref 22–32)

## 2021-04-15 LAB — PROTIME-INR
INR: 1 (ref 0.8–1.2)
Prothrombin Time: 12.8 seconds (ref 11.4–15.2)

## 2021-04-15 LAB — TROPONIN I (HIGH SENSITIVITY): Troponin I (High Sensitivity): 5 ng/L (ref <18)

## 2021-04-15 MED ORDER — LACTATED RINGERS IV BOLUS
500.0000 mL | Freq: Once | INTRAVENOUS | Status: AC
  Administered 2021-04-15: 500 mL via INTRAVENOUS

## 2021-04-15 MED ORDER — HIGH POTENCY IRON 65 MG PO TABS: 0.5000 | ORAL_TABLET | Freq: Every day | ORAL | Status: DC

## 2021-04-15 NOTE — ED Triage Notes (Signed)
Pt BIB EMS from home. Pt c/o near syncopal episode when she was getting out of car. Pt c/o lethargy x3 weeks. Pt had oncology appt today. Pt has hx of breast cancer, pt takes PO meds for. Pt denies LOC, pt did not hit head, no fall. EMS reports negative orthostatics. Has been c/o general malaise x3 weeks prior. Finished round of abx this morning for UTI.   CBG 218 BP 120/50 HR 55-60 SpO2 100% RA  **LUE RESTRICTION**  20G R FA

## 2021-04-15 NOTE — ED Provider Notes (Signed)
[facility] DEPT Provider Note   CSN: [id number] Arrival date & time: 04/15/21  1507     History Chief Complaint  Patient presents with   Near Syncope    Monica Abbott is a 70 y.o. female.   Near Syncope Pertinent negatives include no chest pain, no abdominal pain, no headaches and no shortness of breath. Patient presents for a near syncopal episode.  Episode occurred when she was standing up getting out of her car.  She was recently on antibiotics for UTI.  She takes letrozole for her breast cancer history.  She states that she intermittently gets feelings of flushing.  She has described this to her primary doctor who does attribute it to this medication.  Today, she had an appointment with her oncology doctor.  After this appointment, patient went to [place].  She states that she ate a larger amount than normal.  She then traveled to her son's house.  It was at her son's house that she had this episode.  Patient describes a prodrome of feeling flushed.  Episode consisted of feeling lightheaded, nauseous, and clammy.  Patient did not lose consciousness.  All symptoms resolved and patient then came to the ED for evaluation.  She states that she has had similar episodes in the past.  She has not had any recent infectious symptoms.  Recent UTI was found incidentally on a urinalysis without any previous symptoms.  She has been eating and drinking normally.  During this episode, she did not experience any chest discomfort or shortness of breath.  She denies any current symptoms.     Past Medical History:  Diagnosis Date   Anemia    before hysterectomy, none now   Cancer ([facility]) 03/2018   left breast cancer   Family history of breast cancer    Family history of colon cancer    Family history of ovarian cancer    Family history of pancreatic cancer    Hypertension    Personal history of radiation therapy     Patient Active Problem List   Diagnosis  Date Noted   Genetic testing 04/25/2018   Family history of breast cancer    Family history of colon cancer    Family history of pancreatic cancer    Family history of ovarian cancer    Malignant neoplasm of upper-outer quadrant of left breast in female, estrogen receptor positive ([facility]) 04/05/2018    Past Surgical History:  Procedure Laterality Date   ABDOMINAL HYSTERECTOMY     BREAST LUMPECTOMY Left    2019   BREAST LUMPECTOMY WITH RADIOACTIVE SEED AND SENTINEL LYMPH NODE BIOPSY Left 04/27/2018   Procedure: LEFT BREAST LUMPECTOMY WITH RADIOACTIVE SEED AND SENTINEL LYMPH NODE BIOPSY;  Surgeon: Erroll Luna, MD;  Location: [facility];  Service: General;  Laterality: Left;   HYSTERECTOMY ABDOMINAL WITH SALPINGECTOMY     PARATHYROIDECTOMY       OB History   No obstetric history on file.     Family History  Problem Relation Age of Onset   Ovarian cancer Mother    Colon cancer Father    Pancreatic cancer Sister 65   Breast cancer Sister    Breast cancer Sister    Skin cancer Sister 22   Breast cancer Sister    Colon cancer Maternal Aunt        dx in her 86s   Breast cancer Maternal Aunt        dx under 75s  Cancer Maternal Uncle        NOS   Breast cancer Paternal Aunt    Lung cancer Paternal Uncle        non-smoker   Diabetes Maternal Grandmother    Stroke Maternal Grandmother    Colon cancer Maternal Grandfather    Cancer Paternal Grandfather        NOS   Bladder Cancer Brother 38       smoker   Kidney cancer Brother 32    Social History   Tobacco Use   Smoking status: Never   Smokeless tobacco: Never  Substance Use Topics   Alcohol use: Never   Drug use: Never    Home Medications Prior to Admission medications   Medication Sig Start Date End Date Taking? Authorizing Provider  atorvastatin (LIPITOR) 10 MG tablet Take 10 mg by mouth daily.   Yes [provider]  Cholecalciferol (VITAMIN D PO) Take 25 mcg by mouth daily.   Yes  [provider]  Ferrous Sulfate Dried (HIGH POTENCY IRON) 65 MG TABS Take 0.5 tablets by mouth daily. 04/15/21  Yes Nicholas Lose, MD  ketotifen (ZADITOR) 0.025 % ophthalmic solution Place 1 drop into both eyes 2 (two) times daily.   Yes [provider]  letrozole ([place]) 2.5 MG tablet TAKE 1 TABLET BY MOUTH EVERY DAY Patient taking differently: Take 2.5 mg by mouth daily. 03/19/21  Yes Nicholas Lose, MD  lisinopril (PRINIVIL,ZESTRIL) 10 MG tablet Take 10 mg by mouth daily.   Yes [provider]  meloxicam (MOBIC) 15 MG tablet Take 15 mg by mouth daily.   Yes [provider]  Multiple Vitamin (MULTIVITAMIN WITH MINERALS) TABS tablet Take 1 tablet by mouth daily.   Yes [provider]  Omega-3 Fatty Acids (FISH OIL) 1000 MG CPDR Take 1,000 mg by mouth daily.   Yes [provider]  oxybutynin (DITROPAN) 5 MG tablet Take 5 mg by mouth 2 (two) times daily.   Yes [provider]  nitrofurantoin, macrocrystal-monohydrate, (MACROBID) 100 MG capsule Take 100 mg by mouth 2 (two) times daily. Start date : 04/07/21 04/07/21   [provider]    Allergies    Patient has no known allergies.  Review of Systems   Review of Systems  Constitutional:  Positive for chills and diaphoresis. Negative for fever.  HENT:  Negative for ear pain, facial swelling, sore throat and trouble swallowing.   Eyes:  Negative for pain and visual disturbance.  Respiratory:  Negative for cough, choking, shortness of breath and wheezing.   Cardiovascular:  Positive for near-syncope. Negative for chest pain and palpitations.  Gastrointestinal:  Positive for nausea. Negative for abdominal pain, diarrhea and vomiting.  Genitourinary:  Negative for dysuria, flank pain, hematuria and pelvic pain.  Musculoskeletal:  Negative for arthralgias, back pain, gait problem, myalgias and neck pain.  Skin:  Positive for pallor. Negative for color change and rash.   Neurological:  Positive for dizziness and light-headedness. Negative for seizures, facial asymmetry, speech difficulty, weakness, numbness and headaches. Syncope: Near syncope. Psychiatric/Behavioral:  Negative for confusion and decreased concentration.   All other systems reviewed and are negative.  Physical Exam Updated Vital Signs BP 116/62 (BP Location: Right Arm)   Pulse 70   Temp 98.4 F (36.9 C) (Oral)   Resp 17   SpO2 98%   Physical Exam Vitals and nursing note reviewed.  Constitutional:      General: She is not in acute distress.    Appearance:  She is well-developed and normal weight. She is not ill-appearing, toxic-appearing or diaphoretic.  HENT:     Head: Normocephalic and atraumatic.     Right Ear: External ear normal.     Left Ear: External ear normal.     Nose: Nose normal.     Mouth/Throat:     Mouth: Mucous membranes are moist.     Pharynx: Oropharynx is clear.  Eyes:     Extraocular Movements: Extraocular movements intact.     Conjunctiva/sclera: Conjunctivae normal.  Cardiovascular:     Rate and Rhythm: Normal rate and regular rhythm.     Heart sounds: No murmur heard. Pulmonary:     Effort: Pulmonary effort is normal. No respiratory distress.     Breath sounds: Normal breath sounds. No wheezing or rales.  Chest:     Chest wall: No tenderness.  Abdominal:     Palpations: Abdomen is soft.     Tenderness: There is no abdominal tenderness. There is no guarding.  Musculoskeletal:        General: Normal range of motion.     Cervical back: Normal range of motion and neck supple. No rigidity.  Skin:    General: Skin is warm and dry.     Coloration: Skin is not jaundiced or pale.  Neurological:     General: No focal deficit present.     Mental Status: She is alert and oriented to person, place, and time.     Cranial Nerves: No cranial nerve deficit.     Sensory: No sensory deficit.     Motor: No weakness.     Coordination: Coordination normal.   Psychiatric:        Mood and Affect: Mood normal.        Behavior: Behavior normal.        Thought Content: Thought content normal.        Judgment: Judgment normal.    ED Results / Procedures / Treatments   Labs (all labs ordered are listed, but only abnormal results are displayed) Labs Reviewed  CBC WITH DIFFERENTIAL/PLATELET - Abnormal; Notable for the following components:      Result Value   RBC 5.23 (*)    Hemoglobin 15.8 (*)    HCT 46.3 (*)    Neutro Abs 8.6 (*)    All other components within normal limits  COMPREHENSIVE METABOLIC PANEL - Abnormal; Notable for the following components:   Glucose, Bld 194 (*)    BUN 24 (*)    Creatinine, Ser 1.21 (*)    Calcium 10.6 (*)    Total Protein 8.6 (*)    GFR, Estimated 49 (*)    All other components within normal limits  URINALYSIS, ROUTINE W REFLEX MICROSCOPIC - Abnormal; Notable for the following components:   APPearance HAZY (*)    Ketones, ur 5 (*)    All other components within normal limits  I-STAT CHEM 8, ED - Abnormal; Notable for the following components:   Creatinine, Ser 1.20 (*)    Glucose, Bld 194 (*)    Hemoglobin 15.3 (*)    All other components within normal limits  PROTIME-INR  CBG MONITORING, ED  TROPONIN I (HIGH SENSITIVITY)  TROPONIN I (HIGH SENSITIVITY)    EKG None ED ECG REPORT   Date: 04/15/2021  Rate: normal  Rhythm: normal sinus rhythm  QRS Axis: normal  Intervals: normal  ST/T Wave abnormalities: normal       Radiology DG Chest Port 1 View  Result Date:  04/15/2021 CLINICAL DATA:  Near syncope, lethargy for 3 weeks, history of left breast cancer EXAM: PORTABLE CHEST 1 VIEW COMPARISON:  None. FINDINGS: The heart size and mediastinal contours are within normal limits. Both lungs are clear. The visualized skeletal structures are unremarkable. IMPRESSION: No active disease. Electronically Signed   By: Randa Ngo M.D.   On: 04/15/2021 17:49    Procedures Procedures   Medications  Ordered in ED Medications  lactated ringers bolus 500 mL (0 mLs Intravenous Stopped 04/15/21 2005)    ED Course  I have reviewed the triage vital signs and the nursing notes.  Pertinent labs & imaging results that were available during my care of the patient were reviewed by me and considered in my medical decision making (see chart for details).    MDM Rules/Calculators/A&P                          Patient presents to the ED following a near syncopal episode.  Since this episode, all symptoms have resolved.  On arrival in the ED, patient has normal vital signs.  She is well-appearing.  No abnormal findings on cardiac and lung auscultation.  Patient has no focal neurologic deficits.  Patient was kept on cardiac monitor and work-up was initiated.  EKG showed no evidence of arrhythmia, ischemia, or heart strain.  500 cc bolus of IV fluids was ordered.  Lab work, including troponin was reassuring.  Patient had recently been diagnosed with UTI which was treated with antibiotics.  Today, urine does not show evidence of infection.  No leukocytosis and no anemia is evident on CBC.  On reassessment, patient continued to remain asymptomatic.  Upon standing and ambulation, patient continued to feel well. Patient stated that she did feel stressed from having to go to the oncologist today.  Given this in addition to the prodrome of feeling flushed and the absence of any chest discomfort at time of episode, I do suspect this was vasovagal in etiology.   Patient was observed in the ED for several hours without any recurrence of symptoms.  Patient stated that she did feel comfortable going home.  She was encouraged to return to the ED for any recurrence of symptoms.  Patient was discharged in good condition.  Final Clinical Impression(s) / ED Diagnoses Final diagnoses:  Near syncope    Rx / DC Orders ED Discharge Orders     None        Godfrey Pick, MD 04/16/21 0126

## 2021-05-07 DIAGNOSIS — H10023 Other mucopurulent conjunctivitis, bilateral: Secondary | ICD-10-CM | POA: Diagnosis not present

## 2021-07-07 DIAGNOSIS — E785 Hyperlipidemia, unspecified: Secondary | ICD-10-CM | POA: Diagnosis not present

## 2021-07-07 DIAGNOSIS — E559 Vitamin D deficiency, unspecified: Secondary | ICD-10-CM | POA: Diagnosis not present

## 2021-07-07 DIAGNOSIS — I10 Essential (primary) hypertension: Secondary | ICD-10-CM | POA: Diagnosis not present

## 2021-07-15 DIAGNOSIS — Z1211 Encounter for screening for malignant neoplasm of colon: Secondary | ICD-10-CM | POA: Diagnosis not present

## 2021-07-15 DIAGNOSIS — I10 Essential (primary) hypertension: Secondary | ICD-10-CM | POA: Diagnosis not present

## 2021-07-15 DIAGNOSIS — Z1231 Encounter for screening mammogram for malignant neoplasm of breast: Secondary | ICD-10-CM | POA: Diagnosis not present

## 2021-07-15 DIAGNOSIS — E785 Hyperlipidemia, unspecified: Secondary | ICD-10-CM | POA: Diagnosis not present

## 2021-07-15 DIAGNOSIS — C50112 Malignant neoplasm of central portion of left female breast: Secondary | ICD-10-CM | POA: Diagnosis not present

## 2021-07-15 DIAGNOSIS — Z0000 Encounter for general adult medical examination without abnormal findings: Secondary | ICD-10-CM | POA: Diagnosis not present

## 2021-07-15 DIAGNOSIS — R220 Localized swelling, mass and lump, head: Secondary | ICD-10-CM | POA: Diagnosis not present

## 2021-07-15 DIAGNOSIS — Z6830 Body mass index (BMI) 30.0-30.9, adult: Secondary | ICD-10-CM | POA: Diagnosis not present

## 2021-09-21 DIAGNOSIS — D101 Benign neoplasm of tongue: Secondary | ICD-10-CM | POA: Diagnosis not present

## 2021-09-28 DIAGNOSIS — D101 Benign neoplasm of tongue: Secondary | ICD-10-CM | POA: Diagnosis not present

## 2021-09-28 DIAGNOSIS — K134 Granuloma and granuloma-like lesions of oral mucosa: Secondary | ICD-10-CM | POA: Diagnosis not present

## 2021-10-07 ENCOUNTER — Ambulatory Visit
Admission: RE | Admit: 2021-10-07 | Discharge: 2021-10-07 | Disposition: A | Discharge status: Home / Self Care | Payer: Medicare PPO | Source: Ambulatory Visit | Attending: Hematology and Oncology | Admitting: Hematology and Oncology

## 2021-10-07 DIAGNOSIS — C50412 Malignant neoplasm of upper-outer quadrant of left female breast: Secondary | ICD-10-CM

## 2021-10-07 DIAGNOSIS — Z780 Asymptomatic menopausal state: Secondary | ICD-10-CM | POA: Diagnosis not present

## 2021-10-07 DIAGNOSIS — Z170 Estrogen receptor positive status [ER+]: Secondary | ICD-10-CM

## 2022-03-05 ENCOUNTER — Other Ambulatory Visit: Payer: Self-pay | Admitting: Hematology and Oncology

## 2022-03-05 DIAGNOSIS — Z1231 Encounter for screening mammogram for malignant neoplasm of breast: Secondary | ICD-10-CM

## 2022-03-18 ENCOUNTER — Other Ambulatory Visit: Payer: Self-pay | Admitting: Hematology and Oncology

## 2022-03-23 DIAGNOSIS — M1711 Unilateral primary osteoarthritis, right knee: Secondary | ICD-10-CM | POA: Diagnosis not present

## 2022-03-26 DIAGNOSIS — E7800 Pure hypercholesterolemia, unspecified: Secondary | ICD-10-CM | POA: Diagnosis not present

## 2022-03-26 DIAGNOSIS — D509 Iron deficiency anemia, unspecified: Secondary | ICD-10-CM | POA: Diagnosis not present

## 2022-03-26 DIAGNOSIS — M1711 Unilateral primary osteoarthritis, right knee: Secondary | ICD-10-CM | POA: Diagnosis not present

## 2022-03-26 DIAGNOSIS — Z1331 Encounter for screening for depression: Secondary | ICD-10-CM | POA: Diagnosis not present

## 2022-03-26 DIAGNOSIS — Z79899 Other long term (current) drug therapy: Secondary | ICD-10-CM | POA: Diagnosis not present

## 2022-03-26 DIAGNOSIS — Z139 Encounter for screening, unspecified: Secondary | ICD-10-CM | POA: Diagnosis not present

## 2022-03-26 DIAGNOSIS — Z0000 Encounter for general adult medical examination without abnormal findings: Secondary | ICD-10-CM | POA: Diagnosis not present

## 2022-03-26 DIAGNOSIS — Z9181 History of falling: Secondary | ICD-10-CM | POA: Diagnosis not present

## 2022-03-26 DIAGNOSIS — I10 Essential (primary) hypertension: Secondary | ICD-10-CM | POA: Diagnosis not present

## 2022-03-30 DIAGNOSIS — M1711 Unilateral primary osteoarthritis, right knee: Secondary | ICD-10-CM | POA: Diagnosis not present

## 2022-04-11 NOTE — Progress Notes (Signed)
Patient Care Team: Claudine Mouton, FNP as PCP - General (Family Medicine) Erroll Luna, MD as Consulting Physician (General Surgery) Nicholas Lose, MD as Consulting Physician (Hematology and Oncology) Kyung Rudd, MD as Consulting Physician (Radiation Oncology)  DIAGNOSIS: No diagnosis found.  SUMMARY OF ONCOLOGIC HISTORY: Oncology History  Malignant neoplasm of upper-outer quadrant of left breast in female, estrogen receptor positive ([facility])  03/31/2018 Initial Diagnosis   Screening mammogram detected retroareolar solid and cystic mass at 1 o'clock position measuring 0.7 cm, adjacent smaller mass 0.4 cm not biopsied.  Biopsy of the retroareolar mass is grade 1 invasive ductal carcinoma ER 100%, PR 100%, Ki-67 10%, HER-2 negative, T1b N0 stage Ia AJCC 8   04/24/2018 Genetic Testing   PTCH1 c.324G>A VUS identified on the multi-cancer panel.  The Multi-Gene Panel offered by [facility] includes sequencing and/or deletion duplication testing of the following 84 genes: AIP, ALK, APC, ATM, AXIN2,BAP1,  BARD1, BLM, BMPR1A, BRCA1, BRCA2, BRIP1, CASR, CDC73, CDH1, CDK4, CDKN1B, CDKN1C, CDKN2A (p14ARF), CDKN2A (p16INK4a), CEBPA, CHEK2, CTNNA1, DICER1, DIS3L2, EGFR (c.2369C>T, p.Thr790Met variant only), EPCAM (Deletion/duplication testing only), FH, FLCN, GATA2, GPC3, GREM1 (Promoter region deletion/duplication testing only), HOXB13 (c.251G>A, p.Gly84Glu), HRAS, KIT, MAX, MEN1, MET, MITF (c.952G>A, p.Glu318Lys variant only), MLH1, MSH2, MSH3, MSH6, MUTYH, NBN, NF1, NF2, NTHL1, PALB2, PDGFRA, PHOX2B, PMS2, POLD1, POLE, POT1, PRKAR1A, PTCH1, PTEN, RAD50, RAD51C, RAD51D, RB1, RECQL4, RET, RUNX1, SDHAF2, SDHA (sequence changes only), SDHB, SDHC, SDHD, SMAD4, SMARCA4, SMARCB1, SMARCE1, STK11, SUFU, TERC, TERT, TMEM127, TP53, TSC1, TSC2, VHL, WRN and WT1.  The report date is April 24, 2018.   04/27/2018 Surgery   Left lumpectomy: IDC grade 1, 1.5 cm, DCIS intermediate grade, negative for lymphovascular or  perineural invasion, 1/3 lymph nodes positive with extranodal extension, ER 100%, PR 100%, HER-2 negative, Ki-67 10%, T1cN1a stage Ib Mammaprint  Low risk   06/06/2018 - 07/17/2018 Radiation Therapy   Adjuvant radiation therapy   07/2018 -  Anti-estrogen oral therapy   Letrozole daily     CHIEF COMPLIANT: Follow-up of left breast cancer on letrozole     INTERVAL HISTORY: Monica Abbott is a 71 y.o. with above-mentioned history of left breast cancer treated with lumpectomy, radiation, and who is currently on anti-estrogen therapy with letrozole. She presents to the clinic today for annual follow-up.   ALLERGIES:  has No Known Allergies.  MEDICATIONS:  Current Outpatient Medications  Medication Sig Dispense Refill   atorvastatin (LIPITOR) 10 MG tablet Take 10 mg by mouth daily.     Cholecalciferol (VITAMIN D PO) Take 25 mcg by mouth daily.     Ferrous Sulfate Dried (HIGH POTENCY IRON) 65 MG TABS Take 0.5 tablets by mouth daily. 30 tablet    ketotifen (ZADITOR) 0.025 % ophthalmic solution Place 1 drop into both eyes 2 (two) times daily.     letrozole ([place]) 2.5 MG tablet TAKE 1 TABLET BY MOUTH EVERY DAY 90 tablet 3   lisinopril (PRINIVIL,ZESTRIL) 10 MG tablet Take 10 mg by mouth daily.     meloxicam (MOBIC) 15 MG tablet Take 15 mg by mouth daily.     Multiple Vitamin (MULTIVITAMIN WITH MINERALS) TABS tablet Take 1 tablet by mouth daily.     nitrofurantoin, macrocrystal-monohydrate, (MACROBID) 100 MG capsule Take 100 mg by mouth 2 (two) times daily. Start date : 04/07/21     Omega-3 Fatty Acids (FISH OIL) 1000 MG CPDR Take 1,000 mg by mouth daily.     oxybutynin (DITROPAN) 5 MG tablet Take 5 mg by mouth 2 (  two) times daily.     No current facility-administered medications for this visit.    PHYSICAL EXAMINATION: ECOG PERFORMANCE STATUS: {CHL ONC ECOG PS:[phone]}  There were no vitals filed for this visit. There were no vitals filed for this visit.  BREAST:*** No  palpable masses or nodules in either right or left breasts. No palpable axillary supraclavicular or infraclavicular adenopathy no breast tenderness or nipple discharge. (exam performed in the presence of a chaperone)  LABORATORY DATA:  I have reviewed the data as listed    Latest Ref Rng & Units 04/15/2021    5:02 PM 04/15/2021    4:19 PM 04/12/2018    8:04 AM  CMP  Glucose 70 - 99 mg/dL 194  194  106   BUN 8 - 23 mg/dL 23  24  17    Creatinine 0.44 - 1.00 mg/dL 1.20  1.21  0.99   Sodium 135 - 145 mmol/L 143  144  142   Potassium 3.5 - 5.1 mmol/L 4.0  4.1  4.5   Chloride 98 - 111 mmol/L 104  107  107   CO2 22 - 32 mmol/L  29  27   Calcium 8.9 - 10.3 mg/dL  10.6  9.8   Total Protein 6.5 - 8.1 g/dL  8.6  7.4   Total Bilirubin 0.3 - 1.2 mg/dL  0.7  0.4   Alkaline Phos 38 - 126 U/L  83  76   AST 15 - 41 U/L  26  20   ALT 0 - 44 U/L  22  20     Lab Results  Component Value Date   WBC 10.2 04/15/2021   HGB 15.3 (H) 04/15/2021   HCT 45.0 04/15/2021   MCV 88.5 04/15/2021   PLT 292 04/15/2021   NEUTROABS 8.6 (H) 04/15/2021    ASSESSMENT & PLAN:  No problem-specific Assessment & Plan notes found for this encounter.    No orders of the defined types were placed in this encounter.  The patient has a good understanding of the overall plan. she agrees with it. she will call with any problems that may develop before the next visit here. Total time spent: 30 mins including face to face time and time spent for planning, charting and co-ordination of care   Suzzette Righter, [place] 04/11/22    I Gardiner Coins am scribing for Dr. Lindi Adie  ***

## 2022-04-14 ENCOUNTER — Ambulatory Visit
Admission: RE | Admit: 2022-04-14 | Discharge: 2022-04-14 | Disposition: A | Discharge status: Home / Self Care | Payer: Medicare PPO | Source: Ambulatory Visit | Attending: Hematology and Oncology | Admitting: Hematology and Oncology

## 2022-04-14 DIAGNOSIS — Z1231 Encounter for screening mammogram for malignant neoplasm of breast: Secondary | ICD-10-CM | POA: Diagnosis not present

## 2022-04-15 ENCOUNTER — Other Ambulatory Visit: Payer: Self-pay

## 2022-04-15 ENCOUNTER — Inpatient Hospital Stay: Payer: Medicare PPO | Attending: Hematology and Oncology | Admitting: Hematology and Oncology

## 2022-04-15 DIAGNOSIS — C50412 Malignant neoplasm of upper-outer quadrant of left female breast: Secondary | ICD-10-CM | POA: Insufficient documentation

## 2022-04-15 DIAGNOSIS — Z79811 Long term (current) use of aromatase inhibitors: Secondary | ICD-10-CM | POA: Insufficient documentation

## 2022-04-15 DIAGNOSIS — Z170 Estrogen receptor positive status [ER+]: Secondary | ICD-10-CM | POA: Diagnosis not present

## 2022-04-15 NOTE — Assessment & Plan Note (Signed)
Left lumpectomy: IDC grade 1, 1.5 cm, DCIS intermediate grade, negative for lymphovascular or perineural invasion, 1/3 lymph nodes positive with extranodal extension, ER 100%, PR 100%, HER-2 negative, Ki-67 10%, T1c and 1 a stage Ib Mammaprint low risk luminal type a Adjuvant radiation10/15/2019 to 07/17/2018  Current treatment:Adjuvant antiestrogen therapywith letrozole 2.5 mg daily x7 yearsstarted 07/17/2018  Letrozoletoxicities: Decreased memory: Patient was an avid reader  Muscle aches and pains: improved with activity  I renewed the prescription for another year  Breast cancer surveillance: Mammogram  04/14/2022: Results are pending Breast exam  04/15/2022: Benign  Return to clinic in 1 year for follow-up

## 2022-04-27 DIAGNOSIS — M1711 Unilateral primary osteoarthritis, right knee: Secondary | ICD-10-CM | POA: Diagnosis not present

## 2022-04-27 DIAGNOSIS — M25561 Pain in right knee: Secondary | ICD-10-CM | POA: Diagnosis not present

## 2022-05-12 DIAGNOSIS — Z01818 Encounter for other preprocedural examination: Secondary | ICD-10-CM | POA: Diagnosis not present

## 2022-05-12 DIAGNOSIS — M1711 Unilateral primary osteoarthritis, right knee: Secondary | ICD-10-CM | POA: Diagnosis not present

## 2022-05-18 ENCOUNTER — Ambulatory Visit: Payer: Self-pay | Admitting: Student

## 2022-06-08 ENCOUNTER — Ambulatory Visit: Payer: Self-pay | Admitting: Student

## 2022-06-08 NOTE — H&P (Signed)
TOTAL KNEE ADMISSION H&P  Patient is being admitted for right total knee arthroplasty.  Subjective:  Chief Complaint:right knee pain.  HPI: Monica Abbott, 71 y.o. female, has a history of pain and functional disability in the right knee due to arthritis and has failed non-surgical conservative treatments for greater than 12 weeks to includeNSAID's and/or analgesics, flexibility and strengthening excercises, supervised PT with diminished ADL's post treatment, use of assistive devices, and activity modification.  Onset of symptoms was gradual, starting 6 years ago with rapidlly worsening course since that time. The patient noted no past surgery on the right knee(s).  Patient currently rates pain in the right knee(s) at 10 out of 10 with activity. Patient has night pain, worsening of pain with activity and weight bearing, pain that interferes with activities of daily living, pain with passive range of motion, crepitus, and joint swelling.  Patient has evidence of subchondral cysts, subchondral sclerosis, periarticular osteophytes, and joint space narrowing by imaging studies. There is no active infection.  Patient Active Problem List   Diagnosis Date Noted   Genetic testing 04/25/2018   Family history of breast cancer    Family history of colon cancer    Family history of pancreatic cancer    Family history of ovarian cancer    Malignant neoplasm of upper-outer quadrant of left breast in female, estrogen receptor positive ([place]) 04/05/2018   Past Medical History:  Diagnosis Date   Anemia    before hysterectomy, none now   Cancer ([place]) 03/2018   left breast cancer   Family history of breast cancer    Family history of colon cancer    Family history of ovarian cancer    Family history of pancreatic cancer    Hypertension    Personal history of radiation therapy     Past Surgical History:  Procedure Laterality Date   ABDOMINAL HYSTERECTOMY     BREAST LUMPECTOMY Left    2019    BREAST LUMPECTOMY WITH RADIOACTIVE SEED AND SENTINEL LYMPH NODE BIOPSY Left 04/27/2018   Procedure: LEFT BREAST LUMPECTOMY WITH RADIOACTIVE SEED AND SENTINEL LYMPH NODE BIOPSY;  Surgeon: Erroll Luna, MD;  Location: [facility];  Service: General;  Laterality: Left;   HYSTERECTOMY ABDOMINAL WITH SALPINGECTOMY     PARATHYROIDECTOMY      Current Outpatient Medications  Medication Sig Dispense Refill Last Dose   acetaminophen (TYLENOL) 500 MG tablet Take 500-1,000 mg by mouth every 6 (six) hours as needed (pain.).      atorvastatin (LIPITOR) 10 MG tablet Take 10 mg by mouth at bedtime.      Carboxymethylcellulose Sodium (THERATEARS OP) Place 1 drop into both eyes in the morning and at bedtime.      celecoxib (CELEBREX) 200 MG capsule Take 200 mg by mouth daily with supper.      Cholecalciferol (VITAMIN D3) 125 MCG (5000 UT) TABS Take 5,000 Units by mouth in the morning.      Ferrous Sulfate Dried (HIGH POTENCY IRON) 65 MG TABS Take 0.5 tablets by mouth daily. (Patient taking differently: Take 0.5 tablets by mouth daily with supper.) 30 tablet     letrozole (FEMARA) 2.5 MG tablet TAKE 1 TABLET BY MOUTH EVERY DAY 90 tablet 3    lisinopril (PRINIVIL,ZESTRIL) 10 MG tablet Take 10 mg by mouth in the morning.      Multiple Vitamin (MULTIVITAMIN WITH MINERALS) TABS tablet Take 1 tablet by mouth in the morning. Senior Multivitamin      Omega-3 Fatty Acids (  FISH OIL) 1000 MG CAPS Take by mouth in the morning.      oxybutynin (DITROPAN) 5 MG tablet Take 5 mg by mouth 2 (two) times daily.      TURMERIC CURCUMIN PO Take 1,000 mg by mouth in the morning.      No current facility-administered medications for this visit.   No Active Allergies  Social History   Tobacco Use   Smoking status: Never   Smokeless tobacco: Never  Substance Use Topics   Alcohol use: Never    Family History  Problem Relation Age of Onset   Ovarian cancer Mother    Colon cancer Father    Pancreatic cancer  Sister 55   Breast cancer Sister    Breast cancer Sister    Skin cancer Sister 24   Breast cancer Sister    Colon cancer Maternal Aunt        dx in her 64s   Breast cancer Maternal Aunt        dx under 59s   Cancer Maternal Uncle        NOS   Breast cancer Paternal Aunt    Lung cancer Paternal Uncle        non-smoker   Diabetes Maternal Grandmother    Stroke Maternal Grandmother    Colon cancer Maternal Grandfather    Cancer Paternal Grandfather        NOS   Bladder Cancer Brother 13       smoker   Kidney cancer Brother 4     Review of Systems  Musculoskeletal:  Positive for arthralgias and joint swelling.  All other systems reviewed and are negative.   Objective:  Physical Exam Constitutional:      Appearance: Normal appearance.  HENT:     Head: Normocephalic and atraumatic.     Nose: Nose normal.     Mouth/Throat:     Mouth: Mucous membranes are moist.     Pharynx: Oropharynx is clear.  Eyes:     Conjunctiva/sclera: Conjunctivae normal.  Cardiovascular:     Rate and Rhythm: Normal rate and regular rhythm.     Pulses: Normal pulses.     Heart sounds: Normal heart sounds.  Pulmonary:     Effort: Pulmonary effort is normal.     Breath sounds: Normal breath sounds.  Abdominal:     General: Abdomen is flat.     Palpations: Abdomen is soft.  Genitourinary:    Comments: deferred Musculoskeletal:     Cervical back: Normal range of motion and neck supple.     Comments: Examination of the right knee reveals no skin wounds or lesions. No erythema. Mild swelling, no effusion noted. Pain with palpation over the medial joint line, and patellar movements. Crepitus noted with flexion and extension. Range of motion 0 to 110 degrees. No ligamentous instability noted.   Sensory and motor function intact in LE bilaterally. Palpable pedal pulses.   No significant pedal edema. Calves soft and non-tender.   Skin:    General: Skin is warm and dry.     Capillary Refill:  Capillary refill takes less than 2 seconds.  Neurological:     General: No focal deficit present.     Mental Status: She is alert and oriented to person, place, and time.  Psychiatric:        Mood and Affect: Mood normal.        Behavior: Behavior normal.        Thought Content: Thought content normal.  Judgment: Judgment normal.     Vital signs in last 24 hours: '@VSRANGES'$ @  Labs:   Estimated body mass index is 31.58 kg/m as calculated from the following:   Height as of 04/15/22: '5\' 3"'$  (1.6 m).   Weight as of 04/15/22: 80.9 kg.   Imaging Review Plain radiographs demonstrate severe degenerative joint disease of the right knee(s). The overall alignment ismild varus. The bone quality appears to be adequate for age and reported activity level.      Assessment/Plan:  End stage arthritis, right knee   The patient history, physical examination, clinical judgment of the provider and imaging studies are consistent with end stage degenerative joint disease of the right knee(s) and total knee arthroplasty is deemed medically necessary. The treatment options including medical management, injection therapy arthroscopy and arthroplasty were discussed at length. The risks and benefits of total knee arthroplasty were presented and reviewed. The risks due to aseptic loosening, infection, stiffness, patella tracking problems, thromboembolic complications and other imponderables were discussed. The patient acknowledged the explanation, agreed to proceed with the plan and consent was signed. Patient is being admitted for inpatient treatment for surgery, pain control, PT, OT, prophylactic antibiotics, VTE prophylaxis, progressive ambulation and ADL's and discharge planning. The patient is planning to be discharged home after an overnight stay with OPPT.  Therapy Plans: outpatient therapy at [facility]. Disposition: Home with husband Planned DVT Prophylaxis: aspirin '81mg'$  BID DME needed:  walker. Iceman today.  PCP: Cleared. TXA: IV Allergies:  - Nickel - earrings, erythema and swelling.  Anesthesia Concerns: None.  BMI: 31.1 Last HgbA1c: 5.8 Other: - Needs nickel free knee.  - Cr 0.90, Hgb 14.1.  - Oxycodone, zofran, celebrex.      Patient's anticipated LOS is less than 2 midnights, meeting these requirements: - Younger than 84 - Lives within 1 hour of care - Has a competent adult at home to recover with post-op recover - NO history of  - Chronic pain requiring opiods  - Diabetes  - Coronary Artery Disease  - Heart failure  - Heart attack  - Stroke  - DVT/VTE  - Cardiac arrhythmia  - Respiratory Failure/COPD  - Renal failure  - Anemia  - Advanced Liver disease

## 2022-06-08 NOTE — H&P (View-Only) (Signed)
TOTAL KNEE ADMISSION H&P  Patient is being admitted for right total knee arthroplasty.  Subjective:  Chief Complaint:right knee pain.  HPI: Monica Abbott, 71 y.o. female, has a history of pain and functional disability in the right knee due to arthritis and has failed non-surgical conservative treatments for greater than 12 weeks to includeNSAID's and/or analgesics, flexibility and strengthening excercises, supervised PT with diminished ADL's post treatment, use of assistive devices, and activity modification.  Onset of symptoms was gradual, starting 6 years ago with rapidlly worsening course since that time. The patient noted no past surgery on the right knee(s).  Patient currently rates pain in the right knee(s) at 10 out of 10 with activity. Patient has night pain, worsening of pain with activity and weight bearing, pain that interferes with activities of daily living, pain with passive range of motion, crepitus, and joint swelling.  Patient has evidence of subchondral cysts, subchondral sclerosis, periarticular osteophytes, and joint space narrowing by imaging studies. There is no active infection.  Patient Active Problem List   Diagnosis Date Noted   Genetic testing 04/25/2018   Family history of breast cancer    Family history of colon cancer    Family history of pancreatic cancer    Family history of ovarian cancer    Malignant neoplasm of upper-outer quadrant of left breast in female, estrogen receptor positive ([place]) 04/05/2018   Past Medical History:  Diagnosis Date   Anemia    before hysterectomy, none now   Cancer ([place]) 03/2018   left breast cancer   Family history of breast cancer    Family history of colon cancer    Family history of ovarian cancer    Family history of pancreatic cancer    Hypertension    Personal history of radiation therapy     Past Surgical History:  Procedure Laterality Date   ABDOMINAL HYSTERECTOMY     BREAST LUMPECTOMY Left    2019    BREAST LUMPECTOMY WITH RADIOACTIVE SEED AND SENTINEL LYMPH NODE BIOPSY Left 04/27/2018   Procedure: LEFT BREAST LUMPECTOMY WITH RADIOACTIVE SEED AND SENTINEL LYMPH NODE BIOPSY;  Surgeon: Erroll Luna, MD;  Location: [facility];  Service: General;  Laterality: Left;   HYSTERECTOMY ABDOMINAL WITH SALPINGECTOMY     PARATHYROIDECTOMY      Current Outpatient Medications  Medication Sig Dispense Refill Last Dose   acetaminophen (TYLENOL) 500 MG tablet Take 500-1,000 mg by mouth every 6 (six) hours as needed (pain.).      atorvastatin (LIPITOR) 10 MG tablet Take 10 mg by mouth at bedtime.      Carboxymethylcellulose Sodium (THERATEARS OP) Place 1 drop into both eyes in the morning and at bedtime.      celecoxib (CELEBREX) 200 MG capsule Take 200 mg by mouth daily with supper.      Cholecalciferol (VITAMIN D3) 125 MCG (5000 UT) TABS Take 5,000 Units by mouth in the morning.      Ferrous Sulfate Dried (HIGH POTENCY IRON) 65 MG TABS Take 0.5 tablets by mouth daily. (Patient taking differently: Take 0.5 tablets by mouth daily with supper.) 30 tablet     letrozole (FEMARA) 2.5 MG tablet TAKE 1 TABLET BY MOUTH EVERY DAY 90 tablet 3    lisinopril (PRINIVIL,ZESTRIL) 10 MG tablet Take 10 mg by mouth in the morning.      Multiple Vitamin (MULTIVITAMIN WITH MINERALS) TABS tablet Take 1 tablet by mouth in the morning. Senior Multivitamin      Omega-3 Fatty Acids (  FISH OIL) 1000 MG CAPS Take by mouth in the morning.      oxybutynin (DITROPAN) 5 MG tablet Take 5 mg by mouth 2 (two) times daily.      TURMERIC CURCUMIN PO Take 1,000 mg by mouth in the morning.      No current facility-administered medications for this visit.   No Active Allergies  Social History   Tobacco Use   Smoking status: Never   Smokeless tobacco: Never  Substance Use Topics   Alcohol use: Never    Family History  Problem Relation Age of Onset   Ovarian cancer Mother    Colon cancer Father    Pancreatic cancer  Sister 36   Breast cancer Sister    Breast cancer Sister    Skin cancer Sister 18   Breast cancer Sister    Colon cancer Maternal Aunt        dx in her 9s   Breast cancer Maternal Aunt        dx under 53s   Cancer Maternal Uncle        NOS   Breast cancer Paternal Aunt    Lung cancer Paternal Uncle        non-smoker   Diabetes Maternal Grandmother    Stroke Maternal Grandmother    Colon cancer Maternal Grandfather    Cancer Paternal Grandfather        NOS   Bladder Cancer Brother 68       smoker   Kidney cancer Brother 8     Review of Systems  Musculoskeletal:  Positive for arthralgias and joint swelling.  All other systems reviewed and are negative.   Objective:  Physical Exam Constitutional:      Appearance: Normal appearance.  HENT:     Head: Normocephalic and atraumatic.     Nose: Nose normal.     Mouth/Throat:     Mouth: Mucous membranes are moist.     Pharynx: Oropharynx is clear.  Eyes:     Conjunctiva/sclera: Conjunctivae normal.  Cardiovascular:     Rate and Rhythm: Normal rate and regular rhythm.     Pulses: Normal pulses.     Heart sounds: Normal heart sounds.  Pulmonary:     Effort: Pulmonary effort is normal.     Breath sounds: Normal breath sounds.  Abdominal:     General: Abdomen is flat.     Palpations: Abdomen is soft.  Genitourinary:    Comments: deferred Musculoskeletal:     Cervical back: Normal range of motion and neck supple.     Comments: Examination of the right knee reveals no skin wounds or lesions. No erythema. Mild swelling, no effusion noted. Pain with palpation over the medial joint line, and patellar movements. Crepitus noted with flexion and extension. Range of motion 0 to 110 degrees. No ligamentous instability noted.   Sensory and motor function intact in LE bilaterally. Palpable pedal pulses.   No significant pedal edema. Calves soft and non-tender.   Skin:    General: Skin is warm and dry.     Capillary Refill:  Capillary refill takes less than 2 seconds.  Neurological:     General: No focal deficit present.     Mental Status: She is alert and oriented to person, place, and time.  Psychiatric:        Mood and Affect: Mood normal.        Behavior: Behavior normal.        Thought Content: Thought content normal.  Judgment: Judgment normal.     Vital signs in last 24 hours: '@VSRANGES'$ @  Labs:   Estimated body mass index is 31.58 kg/m as calculated from the following:   Height as of 04/15/22: '5\' 3"'$  (1.6 m).   Weight as of 04/15/22: 80.9 kg.   Imaging Review Plain radiographs demonstrate severe degenerative joint disease of the right knee(s). The overall alignment ismild varus. The bone quality appears to be adequate for age and reported activity level.      Assessment/Plan:  End stage arthritis, right knee   The patient history, physical examination, clinical judgment of the provider and imaging studies are consistent with end stage degenerative joint disease of the right knee(s) and total knee arthroplasty is deemed medically necessary. The treatment options including medical management, injection therapy arthroscopy and arthroplasty were discussed at length. The risks and benefits of total knee arthroplasty were presented and reviewed. The risks due to aseptic loosening, infection, stiffness, patella tracking problems, thromboembolic complications and other imponderables were discussed. The patient acknowledged the explanation, agreed to proceed with the plan and consent was signed. Patient is being admitted for inpatient treatment for surgery, pain control, PT, OT, prophylactic antibiotics, VTE prophylaxis, progressive ambulation and ADL's and discharge planning. The patient is planning to be discharged home after an overnight stay with OPPT.  Therapy Plans: outpatient therapy at [facility] [place]. Disposition: Home with husband Planned DVT Prophylaxis: aspirin '81mg'$  BID DME needed:  walker. Iceman today.  PCP: Cleared. TXA: IV Allergies:  - Nickel - earrings, erythema and swelling.  Anesthesia Concerns: None.  BMI: 31.1 Last HgbA1c: 5.8 Other: - Needs nickel free knee.  - Cr 0.90, Hgb 14.1.  - Oxycodone, zofran, celebrex.      Patient's anticipated LOS is less than 2 midnights, meeting these requirements: - Younger than 104 - Lives within 1 hour of care - Has a competent adult at home to recover with post-op recover - NO history of  - Chronic pain requiring opiods  - Diabetes  - Coronary Artery Disease  - Heart failure  - Heart attack  - Stroke  - DVT/VTE  - Cardiac arrhythmia  - Respiratory Failure/COPD  - Renal failure  - Anemia  - Advanced Liver disease

## 2022-06-10 NOTE — Progress Notes (Addendum)
COVID Vaccine received:  '[]'$  No '[x]'$  Yes Date of any COVID positive Test in last 90 days: None  PCP - Daiva Eves, MD Cardiologist - none Oncology- Dr. Nicholas Lose  Chest x-ray - 1 view  04-15-2021  Epic EKG - 2019  repeat at PST   Stress Test -  ECHO -  Cardiac Cath -   Pacemaker/ICD device     '[x]'$  N/A Spinal Cord Stimulator:'[x]'$  No '[]'$  Yes      (Remind patient to bring remote DOS) Other Implants:    History of Sleep Apnea? '[x]'$  No '[]'$  Yes   Sleep Study Date:   CPAP used?- '[x]'$  No '[]'$  Yes  (Instruct to bring their mask & Tubing)  Does the patient monitor blood sugar? '[]'$  No '[]'$  Yes  '[x]'$  N/A  Blood Thinner Instructions:None Aspirin Instructions: Last Dose:  ERAS Protocol Ordered: '[]'$  No  '[x]'$  Yes PRE-SURGERY '[x]'$  ENSURE  '[]'$  G2   Comments: Restricted Left Arm (Left breast Cancer)  Activity level: Patient can climb a flight of stairs without difficulty; '[]'$  No CP  '[]'$  No SOB,  but would have _knee pain__   Anesthesia review: HTN  Patient denies shortness of breath, fever, cough and chest pain at PAT appointment.  Patient verbalized understanding and agreement to the Pre-Surgical Instructions that were given to them at this PAT appointment. Patient was also educated of the need to review these PAT instructions again prior to his/her surgery.I reviewed the appropriate phone numbers to call if they have any and questions or concerns.

## 2022-06-10 NOTE — Patient Instructions (Addendum)
SURGICAL WAITING ROOM VISITATION Patients having surgery or a procedure may have no more than 2 support people in the waiting area - these visitors may rotate in the visitor waiting room.   Children under the age of 66 must have an adult with them who is not the patient. If the patient needs to stay at the hospital during part of their recovery, the visitor guidelines for inpatient rooms apply.  PRE-OP VISITATION  Pre-op nurse will coordinate an appropriate time for 1 support person to accompany the patient in pre-op.  This support person may not rotate.  This visitor will be contacted when the time is appropriate for the visitor to come back in the pre-op area.  Please refer to the [facility] website for the visitor guidelines for Inpatients (after your surgery is over and you are in a regular room).  You are not required to quarantine at this time prior to your surgery. However, you must do this: Hand Hygiene often Do NOT share personal items Notify your provider if you are in close contact with someone who has COVID or you develop fever 100.4 or greater, new onset of sneezing, cough, sore throat, shortness of breath or body aches.   If you received a COVID test during your pre-op visit  it is requested that you wear a mask when out in public, stay away from anyone that may not be feeling well and notify your surgeon if you develop symptoms. If you test positive for Covid or have been in contact with anyone that has tested positive in the last 10 days please notify you surgeon.       Your procedure is scheduled on:  Wednesday  June 23, 2022  Report to [facility] Main Entrance.  Report to admitting at: 12:00 Noon  +++++Call this number if you have any questions or problems the morning of surgery [phone]  Do not eat food :After Midnight the night prior to your surgery/procedure.  After Midnight you may have the following liquids until    11:30  AM DAY OF  SURGERY  Clear Liquid Diet Water Black Coffee (sugar ok, NO MILK/CREAM OR CREAMERS)  Tea (sugar ok, NO MILK/CREAM OR CREAMERS) regular and decaf                             Plain Jell-O  with no fruit (NO RED)                                           Fruit ices (not with fruit pulp, NO RED)                                     Popsicles (NO RED)                                                                  Juice: apple, WHITE grape, WHITE cranberry Sports drinks like Gatorade or Powerade (NO RED)  The day of surgery:  Drink ONE (1) Pre-Surgery Clear Ensure at    11:30    AM the morning of surgery. Drink in one sitting. Do not sip.  This drink was given to you during your hospital pre-op appointment visit. Nothing else to drink after completing the Pre-Surgery Clear Ensure, No candy, chewing gum or throat lozenges.    FOLLOW ANY ADDITIONAL PRE OP INSTRUCTIONS YOU RECEIVED FROM YOUR SURGEON'S OFFICE!!!   Oral Hygiene is also important to reduce your risk of infection.        Remember - BRUSH YOUR TEETH THE MORNING OF SURGERY WITH YOUR REGULAR TOOTHPASTE   Take ONLY these medicines the morning of surgery with A SIP OF WATER: Letrozole (Femara), oxybutynin (Ditropan)                    You may not have any metal on your body including hair pins, jewelry, and body piercing  Do not wear make-up, lotions, powders, perfumes or deodorant  Do not wear nail polish including gel and S&S, artificial / acrylic nails, or any other type of covering on natural nails including finger and toenails. If you have artificial nails, gel coating, etc., that needs to be removed by a nail salon, Please have this removed prior to surgery. Not doing so may mean that your surgery could be cancelled or delayed if the Surgeon or anesthesia staff feels like they are unable to monitor you safely.   Do not shave 48 hours prior to surgery to avoid nicks in your skin which may contribute to  postoperative infections.    Contacts, Hearing Aids, dentures or bridgework may not be worn into surgery.   You may bring a small overnight bag with you on the day of surgery, only pack items that are not valuable .[facility] IS NOT RESPONSIBLE   FOR VALUABLES THAT ARE LOST OR STOLEN.   DO NOT [place]. PHARMACY WILL DISPENSE MEDICATIONS LISTED ON YOUR MEDICATION LIST TO YOU DURING YOUR ADMISSION [place]!   Special Instructions: Bring a copy of your healthcare power of attorney and living will documents the day of surgery, if you wish to have them scanned into your [facility] Records- EPIC  Please read over the following fact sheets you were given: IF YOU HAVE QUESTIONS ABOUT YOUR PRE-OP INSTRUCTIONS, PLEASE CALL [phone]  ([place])   [facility] - Preparing for Surgery Before surgery, you can play an important role.  Because skin is not sterile, your skin needs to be as free of germs as possible.  You can reduce the number of germs on your skin by washing with CHG (chlorahexidine gluconate) soap before surgery.  CHG is an antiseptic cleaner which kills germs and bonds with the skin to continue killing germs even after washing. Please DO NOT use if you have an allergy to CHG or antibacterial soaps.  If your skin becomes reddened/irritated stop using the CHG and inform your nurse when you arrive at Short Stay. Do not shave (including legs and underarms) for at least 48 hours prior to the first CHG shower.  You may shave your face/neck.  Please follow these instructions carefully:  1.  Shower with CHG Soap the night before surgery and the  morning of surgery.  2.  If you choose to wash your hair, wash your hair first as usual with your normal  shampoo.  3.  After you shampoo, rinse your hair and body  thoroughly to remove the shampoo.                             4.  Use CHG as you would any other liquid soap.  You can apply chg directly to the  skin and wash.  Gently with a scrungie or clean washcloth.  5.  Apply the CHG Soap to your body ONLY FROM THE NECK DOWN.   Do not use on face/ open                           Wound or open sores. Avoid contact with eyes, ears mouth and genitals (private parts).                       Wash face,  Genitals (private parts) with your normal soap.             6.  Wash thoroughly, paying special attention to the area where your  surgery  will be performed.  7.  Thoroughly rinse your body with warm water from the neck down.  8.  DO NOT shower/wash with your normal soap after using and rinsing off the CHG Soap.            9.  Pat yourself dry with a clean towel.            10.  Wear clean pajamas.            11.  Place clean sheets on your bed the night of your first shower and do not  sleep with pets.  ON THE DAY OF SURGERY : Do not apply any lotions/deodorants the morning of surgery.  Please wear clean clothes to the hospital/surgery center.    FAILURE TO FOLLOW THESE INSTRUCTIONS MAY RESULT IN THE CANCELLATION OF YOUR SURGERY  PATIENT SIGNATURE_________________________________  NURSE SIGNATURE__________________________________  ________________________________________________________________________     Monica Abbott    An incentive spirometer is a tool that can help keep your lungs clear and active. This tool measures how well you are filling your lungs with each breath. Taking long deep breaths may help reverse or decrease the chance of developing breathing (pulmonary) problems (especially infection) following: A long period of time when you are unable to move or be active. BEFORE THE PROCEDURE  If the spirometer includes an indicator to show your best effort, your nurse or respiratory therapist will set it to a desired goal. If possible, sit up straight or lean slightly forward. Try not to slouch. Hold the incentive spirometer in an upright position. INSTRUCTIONS FOR USE  Sit  on the edge of your bed if possible, or sit up as far as you can in bed or on a chair. Hold the incentive spirometer in an upright position. Breathe out normally. Place the mouthpiece in your mouth and seal your lips tightly around it. Breathe in slowly and as deeply as possible, raising the piston or the ball toward the top of the column. Hold your breath for 3-5 seconds or for as long as possible. Allow the piston or ball to fall to the bottom of the column. Remove the mouthpiece from your mouth and breathe out normally. Rest for a few seconds and repeat Steps 1 through 7 at least 10 times every 1-2 hours when you are awake. Take your time and take a few normal breaths between deep breaths. The spirometer may  include an indicator to show your best effort. Use the indicator as a goal to work toward during each repetition. After each set of 10 deep breaths, practice coughing to be sure your lungs are clear. If you have an incision (the cut made at the time of surgery), support your incision when coughing by placing a pillow or rolled up towels firmly against it. Once you are able to get out of bed, walk around indoors and cough well. You may stop using the incentive spirometer when instructed by your caregiver.  RISKS AND COMPLICATIONS Take your time so you do not get dizzy or light-headed. If you are in pain, you may need to take or ask for pain medication before doing incentive spirometry. It is harder to take a deep breath if you are having pain. AFTER USE Rest and breathe slowly and easily. It can be helpful to keep track of a log of your progress. Your caregiver can provide you with a simple table to help with this. If you are using the spirometer at home, follow these instructions: [place] IF:  You are having difficultly using the spirometer. You have trouble using the spirometer as often as instructed. Your pain medication is not giving enough relief while using the  spirometer. You develop fever of 100.5 F (38.1 C) or higher.                                                                                                    SEEK IMMEDIATE MEDICAL CARE IF:  You cough up bloody sputum that had not been present before. You develop fever of 102 F (38.9 C) or greater. You develop worsening pain at or near the incision site. MAKE SURE YOU:  Understand these instructions. Will watch your condition. Will get help right away if you are not doing well or get worse. Document Released: 12/20/2006 Document Revised: 11/01/2011 Document Reviewed: 02/20/2007 [facility] Patient Information 2014 [place], [place].

## 2022-06-11 ENCOUNTER — Encounter (HOSPITAL_COMMUNITY): Payer: Self-pay

## 2022-06-11 ENCOUNTER — Other Ambulatory Visit: Payer: Self-pay

## 2022-06-11 ENCOUNTER — Encounter (HOSPITAL_COMMUNITY)
Admission: RE | Admit: 2022-06-11 | Discharge: 2022-06-11 | Disposition: A | Discharge status: Home / Self Care | Payer: Medicare PPO | Source: Ambulatory Visit | Attending: Orthopedic Surgery | Admitting: Orthopedic Surgery

## 2022-06-11 VITALS — BP 148/60 | HR 62 | Temp 98.1°F | Resp 14 | Ht 63.0 in | Wt 169.0 lb

## 2022-06-11 DIAGNOSIS — R001 Bradycardia, unspecified: Secondary | ICD-10-CM | POA: Insufficient documentation

## 2022-06-11 DIAGNOSIS — Z01818 Encounter for other preprocedural examination: Secondary | ICD-10-CM

## 2022-06-11 DIAGNOSIS — I10 Essential (primary) hypertension: Secondary | ICD-10-CM | POA: Diagnosis not present

## 2022-06-11 HISTORY — DX: Disorder of parathyroid gland, unspecified: E21.5

## 2022-06-11 HISTORY — DX: Unspecified osteoarthritis, unspecified site: M19.90

## 2022-06-11 LAB — CBC
HCT: 41.5 % (ref 36.0–46.0)
Hemoglobin: 14 g/dL (ref 12.0–15.0)
MCH: 29.7 pg (ref 26.0–34.0)
MCHC: 33.7 g/dL (ref 30.0–36.0)
MCV: 88.1 fL (ref 80.0–100.0)
Platelets: 241 10*3/uL (ref 150–400)
RBC: 4.71 MIL/uL (ref 3.87–5.11)
RDW: 12.7 % (ref 11.5–15.5)
WBC: 5.2 10*3/uL (ref 4.0–10.5)
nRBC: 0 % (ref 0.0–0.2)

## 2022-06-11 LAB — BASIC METABOLIC PANEL
Anion gap: 6 (ref 5–15)
BUN: 20 mg/dL (ref 8–23)
CO2: 27 mmol/L (ref 22–32)
Calcium: 9.4 mg/dL (ref 8.9–10.3)
Chloride: 104 mmol/L (ref 98–111)
Creatinine, Ser: 0.98 mg/dL (ref 0.44–1.00)
GFR, Estimated: >60 mL/min (ref >60)
Glucose, Bld: 98 mg/dL (ref 70–99)
Potassium: 4 mmol/L (ref 3.5–5.1)
Sodium: 137 mmol/L (ref 135–145)

## 2022-06-11 LAB — SURGICAL PCR SCREEN
MRSA, PCR: NEGATIVE
Staphylococcus aureus: POSITIVE — AB

## 2022-06-11 NOTE — Progress Notes (Addendum)
Patient's PCR screen is positive for  [facility]. Appropriate notes have been placed on the patient's chart. This note has been routed to Dr.  Lyla Glassing  and Larene Pickett PA for review. The Patient's surgery is currently scheduled for:  Wedensday November 08-2021 at [facility].  Monica Abbott, BSN, CVRN-BC   Pre-Surgical Testing Nurse [place]  [phone]

## 2022-06-23 ENCOUNTER — Ambulatory Visit (HOSPITAL_BASED_OUTPATIENT_CLINIC_OR_DEPARTMENT_OTHER): Payer: Medicare PPO | Admitting: Anesthesiology

## 2022-06-23 ENCOUNTER — Ambulatory Visit (HOSPITAL_COMMUNITY)
Admission: RE | Admit: 2022-06-23 | Discharge: 2022-06-24 | Disposition: A | Discharge status: Home / Self Care | Payer: Medicare PPO | Source: Ambulatory Visit | Attending: Orthopedic Surgery | Admitting: Orthopedic Surgery

## 2022-06-23 ENCOUNTER — Ambulatory Visit (HOSPITAL_COMMUNITY): Payer: Medicare PPO

## 2022-06-23 ENCOUNTER — Encounter (HOSPITAL_COMMUNITY): Payer: Self-pay | Admitting: Orthopedic Surgery

## 2022-06-23 ENCOUNTER — Other Ambulatory Visit: Payer: Self-pay

## 2022-06-23 ENCOUNTER — Ambulatory Visit (HOSPITAL_COMMUNITY): Payer: Medicare PPO | Admitting: Anesthesiology

## 2022-06-23 ENCOUNTER — Encounter (HOSPITAL_COMMUNITY)
Admission: RE | Disposition: A | Discharge status: Home / Self Care | Payer: Self-pay | Source: Ambulatory Visit | Attending: Orthopedic Surgery

## 2022-06-23 DIAGNOSIS — Z96651 Presence of right artificial knee joint: Secondary | ICD-10-CM | POA: Diagnosis not present

## 2022-06-23 DIAGNOSIS — D649 Anemia, unspecified: Secondary | ICD-10-CM | POA: Diagnosis not present

## 2022-06-23 DIAGNOSIS — I10 Essential (primary) hypertension: Secondary | ICD-10-CM | POA: Insufficient documentation

## 2022-06-23 DIAGNOSIS — M1711 Unilateral primary osteoarthritis, right knee: Secondary | ICD-10-CM

## 2022-06-23 DIAGNOSIS — M2550 Pain in unspecified joint: Secondary | ICD-10-CM | POA: Diagnosis not present

## 2022-06-23 DIAGNOSIS — M2540 Effusion, unspecified joint: Secondary | ICD-10-CM | POA: Diagnosis not present

## 2022-06-23 DIAGNOSIS — G8918 Other acute postprocedural pain: Secondary | ICD-10-CM | POA: Diagnosis not present

## 2022-06-23 DIAGNOSIS — Z471 Aftercare following joint replacement surgery: Secondary | ICD-10-CM | POA: Diagnosis not present

## 2022-06-23 HISTORY — PX: KNEE ARTHROPLASTY: SHX992

## 2022-06-23 SURGERY — ARTHROPLASTY, KNEE, TOTAL, USING IMAGELESS COMPUTER-ASSISTED NAVIGATION
Anesthesia: Spinal | Site: Knee | Laterality: Right

## 2022-06-23 MED ORDER — ALUM & MAG HYDROXIDE-SIMETH 200-200-20 MG/5ML PO SUSP: 30.0000 mL | ORAL | Status: DC | PRN

## 2022-06-23 MED ORDER — MENTHOL 3 MG MT LOZG: 1.0000 | LOZENGE | OROMUCOSAL | Status: DC | PRN

## 2022-06-23 MED ORDER — ADULT MULTIVITAMIN W/MINERALS CH
1.0000 | ORAL_TABLET | Freq: Every morning | ORAL | Status: DC
  Administered 2022-06-24: 1 via ORAL
  Filled 2022-06-23: qty 1

## 2022-06-23 MED ORDER — BUPIVACAINE-EPINEPHRINE 0.5% -1:200000 IJ SOLN
INTRAMUSCULAR | Status: AC
  Filled 2022-06-23: qty 1

## 2022-06-23 MED ORDER — KETOROLAC TROMETHAMINE 30 MG/ML IJ SOLN
INTRAMUSCULAR | Status: DC | PRN
  Administered 2022-06-23: 30 mg

## 2022-06-23 MED ORDER — CELECOXIB 200 MG PO CAPS
200.0000 mg | ORAL_CAPSULE | Freq: Two times a day (BID) | ORAL | Status: DC
  Administered 2022-06-23: 200 mg via ORAL
  Filled 2022-06-23: qty 1

## 2022-06-23 MED ORDER — BUPIVACAINE IN DEXTROSE 0.75-8.25 % IT SOLN
INTRATHECAL | Status: DC | PRN
  Administered 2022-06-23: 1.8 mL via INTRATHECAL

## 2022-06-23 MED ORDER — DIPHENHYDRAMINE HCL 12.5 MG/5ML PO ELIX
12.5000 mg | ORAL_SOLUTION | ORAL | Status: DC | PRN
  Administered 2022-06-23: 25 mg via ORAL
  Filled 2022-06-23: qty 10

## 2022-06-23 MED ORDER — OXYCODONE HCL 5 MG PO TABS
10.0000 mg | ORAL_TABLET | ORAL | Status: DC | PRN
  Administered 2022-06-24 (×2): 15 mg via ORAL
  Administered 2022-06-24: 10 mg via ORAL
  Filled 2022-06-23: qty 2
  Filled 2022-06-23 (×2): qty 3

## 2022-06-23 MED ORDER — HYDROMORPHONE HCL 1 MG/ML IJ SOLN: 0.5000 mg | INTRAMUSCULAR | Status: DC | PRN

## 2022-06-23 MED ORDER — FENTANYL CITRATE PF 50 MCG/ML IJ SOSY: 25.0000 ug | PREFILLED_SYRINGE | INTRAMUSCULAR | Status: DC | PRN

## 2022-06-23 MED ORDER — CHLORHEXIDINE GLUCONATE 0.12 % MT SOLN
15.0000 mL | Freq: Once | OROMUCOSAL | Status: AC
  Administered 2022-06-23: 15 mL via OROMUCOSAL

## 2022-06-23 MED ORDER — PHENYLEPHRINE 80 MCG/ML (10ML) SYRINGE FOR IV PUSH (FOR BLOOD PRESSURE SUPPORT)
PREFILLED_SYRINGE | INTRAVENOUS | Status: AC
  Filled 2022-06-23: qty 10

## 2022-06-23 MED ORDER — EPHEDRINE 5 MG/ML INJ
INTRAVENOUS | Status: AC
  Filled 2022-06-23: qty 5

## 2022-06-23 MED ORDER — ASPIRIN 81 MG PO CHEW
81.0000 mg | CHEWABLE_TABLET | Freq: Two times a day (BID) | ORAL | Status: DC
  Administered 2022-06-23 – 2022-06-24 (×2): 81 mg via ORAL
  Filled 2022-06-23 (×2): qty 1

## 2022-06-23 MED ORDER — ACETAMINOPHEN 500 MG PO TABS
1000.0000 mg | ORAL_TABLET | Freq: Once | ORAL | Status: AC
  Administered 2022-06-23: 1000 mg via ORAL
  Filled 2022-06-23: qty 2

## 2022-06-23 MED ORDER — ORAL CARE MOUTH RINSE: 15.0000 mL | OROMUCOSAL | Status: DC | PRN

## 2022-06-23 MED ORDER — POLYVINYL ALCOHOL 1.4 % OP SOLN: 1.0000 [drp] | Freq: Two times a day (BID) | OPHTHALMIC | Status: DC | PRN

## 2022-06-23 MED ORDER — STERILE WATER FOR IRRIGATION IR SOLN
Status: DC | PRN
  Administered 2022-06-23: 2000 mL

## 2022-06-23 MED ORDER — CARBOXYMETHYLCELLULOSE SODIUM 0.25 % OP SOLN: 1.0000 [drp] | Freq: Two times a day (BID) | OPHTHALMIC | Status: DC | PRN

## 2022-06-23 MED ORDER — METOCLOPRAMIDE HCL 5 MG PO TABS: 5.0000 mg | ORAL_TABLET | Freq: Three times a day (TID) | ORAL | Status: DC | PRN

## 2022-06-23 MED ORDER — PHENYLEPHRINE 80 MCG/ML (10ML) SYRINGE FOR IV PUSH (FOR BLOOD PRESSURE SUPPORT)
PREFILLED_SYRINGE | INTRAVENOUS | Status: DC | PRN
  Administered 2022-06-23 (×3): 80 ug via INTRAVENOUS
  Administered 2022-06-23: 40 ug via INTRAVENOUS
  Administered 2022-06-23: 80 ug via INTRAVENOUS

## 2022-06-23 MED ORDER — ONDANSETRON HCL 4 MG PO TABS: 4.0000 mg | ORAL_TABLET | Freq: Four times a day (QID) | ORAL | Status: DC | PRN

## 2022-06-23 MED ORDER — MIDAZOLAM HCL 2 MG/2ML IJ SOLN
1.0000 mg | Freq: Once | INTRAMUSCULAR | Status: AC
  Filled 2022-06-23: qty 2

## 2022-06-23 MED ORDER — METHOCARBAMOL 500 MG PO TABS
500.0000 mg | ORAL_TABLET | Freq: Four times a day (QID) | ORAL | Status: DC | PRN
  Administered 2022-06-23: 500 mg via ORAL
  Filled 2022-06-23: qty 1

## 2022-06-23 MED ORDER — CEFAZOLIN SODIUM-DEXTROSE 2-4 GM/100ML-% IV SOLN
2.0000 g | INTRAVENOUS | Status: AC
  Administered 2022-06-23: 2 g via INTRAVENOUS
  Filled 2022-06-23: qty 100

## 2022-06-23 MED ORDER — ISOPROPYL ALCOHOL 70 % SOLN
Status: DC | PRN
  Administered 2022-06-23: 1 via TOPICAL

## 2022-06-23 MED ORDER — BUPIVACAINE-EPINEPHRINE 0.5% -1:200000 IJ SOLN
INTRAMUSCULAR | Status: DC | PRN
  Administered 2022-06-23: 30 mL

## 2022-06-23 MED ORDER — PROPOFOL 10 MG/ML IV BOLUS
INTRAVENOUS | Status: DC | PRN
  Administered 2022-06-23 (×2): 10 mg via INTRAVENOUS

## 2022-06-23 MED ORDER — PHENYLEPHRINE HCL-NACL 20-0.9 MG/250ML-% IV SOLN
INTRAVENOUS | Status: DC | PRN
  Administered 2022-06-23: 25 ug/min via INTRAVENOUS

## 2022-06-23 MED ORDER — BUPIVACAINE-EPINEPHRINE (PF) 0.5% -1:200000 IJ SOLN
INTRAMUSCULAR | Status: DC | PRN
  Administered 2022-06-23: 20 mL via PERINEURAL

## 2022-06-23 MED ORDER — LIDOCAINE 2% (20 MG/ML) 5 ML SYRINGE
INTRAMUSCULAR | Status: DC | PRN
  Administered 2022-06-23: 50 mg via INTRAVENOUS

## 2022-06-23 MED ORDER — SENNA 8.6 MG PO TABS
1.0000 | ORAL_TABLET | Freq: Two times a day (BID) | ORAL | Status: DC
  Administered 2022-06-23 – 2022-06-24 (×2): 8.6 mg via ORAL
  Filled 2022-06-23 (×2): qty 1

## 2022-06-23 MED ORDER — METHOCARBAMOL 500 MG IVPB - SIMPLE MED: 500.0000 mg | Freq: Four times a day (QID) | INTRAVENOUS | Status: DC | PRN

## 2022-06-23 MED ORDER — METOCLOPRAMIDE HCL 5 MG/ML IJ SOLN: 5.0000 mg | Freq: Three times a day (TID) | INTRAMUSCULAR | Status: DC | PRN

## 2022-06-23 MED ORDER — LACTATED RINGERS IV SOLN: INTRAVENOUS | Status: DC

## 2022-06-23 MED ORDER — ORAL CARE MOUTH RINSE: 15.0000 mL | Freq: Once | OROMUCOSAL | Status: AC

## 2022-06-23 MED ORDER — SODIUM CHLORIDE 0.9 % IV SOLN: INTRAVENOUS | Status: DC

## 2022-06-23 MED ORDER — ATORVASTATIN CALCIUM 10 MG PO TABS
10.0000 mg | ORAL_TABLET | Freq: Every day | ORAL | Status: DC
  Administered 2022-06-23: 10 mg via ORAL
  Filled 2022-06-23: qty 1

## 2022-06-23 MED ORDER — LETROZOLE 2.5 MG PO TABS
2.5000 mg | ORAL_TABLET | Freq: Every day | ORAL | Status: DC
  Administered 2022-06-24: 2.5 mg via ORAL
  Filled 2022-06-23: qty 1

## 2022-06-23 MED ORDER — FENTANYL CITRATE PF 50 MCG/ML IJ SOSY
50.0000 ug | PREFILLED_SYRINGE | Freq: Once | INTRAMUSCULAR | Status: AC
  Administered 2022-06-23: 50 ug via INTRAVENOUS
  Filled 2022-06-23: qty 2

## 2022-06-23 MED ORDER — PROPOFOL 500 MG/50ML IV EMUL
INTRAVENOUS | Status: DC | PRN
  Administered 2022-06-23: 75 ug/kg/min via INTRAVENOUS

## 2022-06-23 MED ORDER — OXYBUTYNIN CHLORIDE 5 MG PO TABS
5.0000 mg | ORAL_TABLET | Freq: Two times a day (BID) | ORAL | Status: DC
  Administered 2022-06-23 – 2022-06-24 (×2): 5 mg via ORAL
  Filled 2022-06-23 (×2): qty 1

## 2022-06-23 MED ORDER — TRANEXAMIC ACID-NACL 1000-0.7 MG/100ML-% IV SOLN
1000.0000 mg | INTRAVENOUS | Status: AC
  Administered 2022-06-23: 1000 mg via INTRAVENOUS
  Filled 2022-06-23: qty 100

## 2022-06-23 MED ORDER — ACETAMINOPHEN 325 MG PO TABS: 325.0000 mg | ORAL_TABLET | Freq: Four times a day (QID) | ORAL | Status: DC | PRN

## 2022-06-23 MED ORDER — ONDANSETRON HCL 4 MG/2ML IJ SOLN
INTRAMUSCULAR | Status: DC | PRN
  Administered 2022-06-23: 4 mg via INTRAVENOUS

## 2022-06-23 MED ORDER — PROPOFOL 500 MG/50ML IV EMUL
INTRAVENOUS | Status: AC
  Filled 2022-06-23: qty 50

## 2022-06-23 MED ORDER — POLYETHYLENE GLYCOL 3350 17 G PO PACK: 17.0000 g | PACK | Freq: Every day | ORAL | Status: DC | PRN

## 2022-06-23 MED ORDER — DOCUSATE SODIUM 100 MG PO CAPS
100.0000 mg | ORAL_CAPSULE | Freq: Two times a day (BID) | ORAL | Status: DC
  Administered 2022-06-23 – 2022-06-24 (×2): 100 mg via ORAL
  Filled 2022-06-23 (×2): qty 1

## 2022-06-23 MED ORDER — ONDANSETRON HCL 4 MG/2ML IJ SOLN: 4.0000 mg | Freq: Four times a day (QID) | INTRAMUSCULAR | Status: DC | PRN

## 2022-06-23 MED ORDER — PHENOL 1.4 % MT LIQD: 1.0000 | OROMUCOSAL | Status: DC | PRN

## 2022-06-23 MED ORDER — EPHEDRINE SULFATE-NACL 50-0.9 MG/10ML-% IV SOSY
PREFILLED_SYRINGE | INTRAVENOUS | Status: DC | PRN
  Administered 2022-06-23: 5 mg via INTRAVENOUS

## 2022-06-23 MED ORDER — KETOROLAC TROMETHAMINE 30 MG/ML IJ SOLN
INTRAMUSCULAR | Status: AC
  Filled 2022-06-23: qty 1

## 2022-06-23 MED ORDER — VITAMIN D3 25 MCG (1000 UNIT) PO TABS
5000.0000 [IU] | ORAL_TABLET | Freq: Every morning | ORAL | Status: DC
  Administered 2022-06-24: 5000 [IU] via ORAL
  Filled 2022-06-23 (×2): qty 5

## 2022-06-23 MED ORDER — 0.9 % SODIUM CHLORIDE (POUR BTL) OPTIME
TOPICAL | Status: DC | PRN
  Administered 2022-06-23: 1000 mL

## 2022-06-23 MED ORDER — SODIUM CHLORIDE 0.9 % IR SOLN
Status: DC | PRN
  Administered 2022-06-23 (×2): 1000 mL

## 2022-06-23 MED ORDER — POVIDONE-IODINE 10 % EX SWAB: 2.0000 | Freq: Once | CUTANEOUS | Status: AC

## 2022-06-23 MED ORDER — SODIUM CHLORIDE (PF) 0.9 % IJ SOLN
INTRAMUSCULAR | Status: DC | PRN
  Administered 2022-06-23: 30 mL

## 2022-06-23 MED ORDER — DEXAMETHASONE SODIUM PHOSPHATE 10 MG/ML IJ SOLN
INTRAMUSCULAR | Status: DC | PRN
  Administered 2022-06-23: 5 mg via INTRAVENOUS

## 2022-06-23 MED ORDER — POVIDONE-IODINE 10 % EX SWAB
2.0000 | Freq: Once | CUTANEOUS | Status: AC
  Administered 2022-06-23: 2 via TOPICAL

## 2022-06-23 MED ORDER — OXYCODONE HCL 5 MG PO TABS: 5.0000 mg | ORAL_TABLET | ORAL | Status: DC | PRN

## 2022-06-23 MED ORDER — CEFAZOLIN SODIUM-DEXTROSE 2-4 GM/100ML-% IV SOLN
2.0000 g | Freq: Four times a day (QID) | INTRAVENOUS | Status: AC
  Administered 2022-06-23 (×2): 2 g via INTRAVENOUS
  Filled 2022-06-23 (×2): qty 100

## 2022-06-23 MED ORDER — SODIUM CHLORIDE (PF) 0.9 % IJ SOLN
INTRAMUSCULAR | Status: AC
  Filled 2022-06-23: qty 30

## 2022-06-23 SURGICAL SUPPLY — 66 items
BAG COUNTER SPONGE SURGICOUNT (BAG) IMPLANT
BAG ZIPLOCK 12X15 (MISCELLANEOUS) IMPLANT
BATTERY INSTRU NAVIGATION (MISCELLANEOUS) ×3 IMPLANT
BLADE SAW RECIPROCATING 77.5 (BLADE) ×1 IMPLANT
BNDG ELASTIC 4X5.8 VLCR STR LF (GAUZE/BANDAGES/DRESSINGS) ×1 IMPLANT
BNDG ELASTIC 6X5.8 VLCR STR LF (GAUZE/BANDAGES/DRESSINGS) ×1 IMPLANT
BOWL CEMENT MIXING ADV NOZZLE (MISCELLANEOUS) IMPLANT
CEMENT BONE REFOBACIN R1X40 US (Cement) IMPLANT
CHLORAPREP W/TINT 26 (MISCELLANEOUS) ×2 IMPLANT
COVER SURGICAL LIGHT HANDLE (MISCELLANEOUS) ×1 IMPLANT
DERMABOND ADVANCED .7 DNX12 (GAUZE/BANDAGES/DRESSINGS) ×2 IMPLANT
DRAPE INCISE IOBAN 66X45 STRL (DRAPES) ×1 IMPLANT
DRAPE SHEET LG 3/4 BI-LAMINATE (DRAPES) ×3 IMPLANT
DRAPE U-SHAPE 47X51 STRL (DRAPES) ×1 IMPLANT
DRSG AQUACEL AG ADV 3.5X10 (GAUZE/BANDAGES/DRESSINGS) ×1 IMPLANT
ELECT BLADE TIP CTD 4 INCH (ELECTRODE) ×1 IMPLANT
ELECT REM PT RETURN 15FT ADLT (MISCELLANEOUS) ×1 IMPLANT
GAUZE SPONGE 4X4 12PLY STRL (GAUZE/BANDAGES/DRESSINGS) ×1 IMPLANT
GLOVE BIO SURGEON STRL SZ7 (GLOVE) ×1 IMPLANT
GLOVE BIO SURGEON STRL SZ8.5 (GLOVE) ×2 IMPLANT
GLOVE BIOGEL PI IND STRL 7.5 (GLOVE) ×1 IMPLANT
GLOVE BIOGEL PI IND STRL 8.5 (GLOVE) ×1 IMPLANT
GOWN SPEC L3 XXLG W/TWL (GOWN DISPOSABLE) ×1 IMPLANT
GOWN STRL REUS W/ TWL XL LVL3 (GOWN DISPOSABLE) ×1 IMPLANT
GOWN STRL REUS W/TWL XL LVL3 (GOWN DISPOSABLE) ×2
HANDPIECE INTERPULSE COAX TIP (DISPOSABLE) ×1
HDLS TROCR DRIL PIN KNEE 75 (PIN) ×1
HOLDER FOLEY CATH W/STRAP (MISCELLANEOUS) ×1 IMPLANT
HOOD PEEL AWAY FLYTE STAYCOOL (MISCELLANEOUS) ×3 IMPLANT
IMPL FEM CR CMT STD SZ 6 RT (Joint) IMPLANT
IMPL PATELLA METAL SZ32X10 (Joint) IMPLANT
IMPLANT FEM CR CMT STD SZ 6 RT (Joint) ×1 IMPLANT
INSERT TIB KNEE EF/3-11 11 RT (Insert) IMPLANT
KIT TURNOVER KIT A (KITS) IMPLANT
MARKER SKIN DUAL TIP RULER LAB (MISCELLANEOUS) ×1 IMPLANT
NDL SAFETY ECLIP 18X1.5 (MISCELLANEOUS) ×1 IMPLANT
NDL SPNL 18GX3.5 QUINCKE PK (NEEDLE) ×1 IMPLANT
NEEDLE SPNL 18GX3.5 QUINCKE PK (NEEDLE) ×1 IMPLANT
NS IRRIG 1000ML POUR BTL (IV SOLUTION) ×1 IMPLANT
PACK TOTAL KNEE CUSTOM (KITS) ×1 IMPLANT
PADDING CAST COTTON 6X4 STRL (CAST SUPPLIES) ×1 IMPLANT
PIN DRILL HDLS TROCAR 75 4PK (PIN) IMPLANT
PROS TIB KNEE PS 0D F RT (Joint) ×1 IMPLANT
PROSTHESIS TIB KNEE PS 0D F RT (Joint) IMPLANT
PROTECTOR NERVE ULNAR (MISCELLANEOUS) ×1 IMPLANT
SAW OSC TIP CART 19.5X105X1.3 (SAW) ×1 IMPLANT
SCREW FEMALE HEX FIX 25X2.5 (ORTHOPEDIC DISPOSABLE SUPPLIES) IMPLANT
SEALER BIPOLAR AQUA 6.0 (INSTRUMENTS) ×1 IMPLANT
SET HNDPC FAN SPRY TIP SCT (DISPOSABLE) ×1 IMPLANT
SET PAD KNEE POSITIONER (MISCELLANEOUS) ×1 IMPLANT
SOLUTION PRONTOSAN WOUND 350ML (IRRIGATION / IRRIGATOR) IMPLANT
SPIKE FLUID TRANSFER (MISCELLANEOUS) ×2 IMPLANT
SUT MNCRL AB 3-0 PS2 18 (SUTURE) ×1 IMPLANT
SUT MNCRL AB 4-0 PS2 18 (SUTURE) IMPLANT
SUT MON AB 2-0 CT1 36 (SUTURE) ×1 IMPLANT
SUT STRATAFIX PDO 1 14 VIOLET (SUTURE) ×1
SUT STRATFX PDO 1 14 VIOLET (SUTURE) ×1
SUT VIC AB 1 CTX 36 (SUTURE) ×2
SUT VIC AB 1 CTX36XBRD ANBCTR (SUTURE) ×2 IMPLANT
SUT VIC AB 2-0 CT1 27 (SUTURE)
SUT VIC AB 2-0 CT1 TAPERPNT 27 (SUTURE) IMPLANT
SUTURE STRATFX PDO 1 14 VIOLET (SUTURE) ×1 IMPLANT
TRAY FOLEY MTR SLVR 14FR STAT (SET/KITS/TRAYS/PACK) IMPLANT
TUBE SUCTION HIGH CAP CLEAR NV (SUCTIONS) ×1 IMPLANT
WATER STERILE IRR 1000ML POUR (IV SOLUTION) ×2 IMPLANT
WRAP KNEE MAXI GEL POST OP (GAUZE/BANDAGES/DRESSINGS) IMPLANT

## 2022-06-23 NOTE — Care Plan (Signed)
Ortho Bundle Case Management Note  Patient Details  Name: Monica Abbott MRN: [id number] Date of Birth: May 07, 1951  R TKA on 06-23-22 DCP:  Home with husband DME:  RW ordered through [facility] PT:  [facility] OP PT on 06-28-22                   DME Arranged:  Gilford Rile rolling DME Agency:  [employer]  HH Arranged:  NA [place] Agency:  NA  Additional Comments: Please contact me with any questions of if this plan should need to change.  Marianne Sofia, RN,CCM [facility]  [phone] 06/23/2022, 11:56 AM

## 2022-06-23 NOTE — Anesthesia Procedure Notes (Signed)
Spinal  Patient location during procedure: OR Start time: 06/23/2022 11:42 AM End time: 06/23/2022 11:47 AM Reason for block: surgical anesthesia Staffing Performed: anesthesiologist  Anesthesiologist: Roderic Palau, MD Performed by: Roderic Palau, MD Authorized by: Roderic Palau, MD   Preanesthetic Checklist Completed: patient identified, IV checked, risks and benefits discussed, surgical consent, monitors and equipment checked, pre-op evaluation and timeout performed Spinal Block Patient position: sitting Prep: DuraPrep Patient monitoring: cardiac monitor, continuous pulse ox and blood pressure Approach: midline Location: L3-4 Injection technique: single-shot Needle Needle type: Pencan  Needle gauge: 24 G Needle length: 9 cm Assessment Sensory level: T8 Events: CSF return Additional Notes Functioning IV was confirmed and monitors were applied. Sterile prep and drape, including hand hygiene and sterile gloves were used. The patient was positioned and the spine was prepped. The skin was anesthetized with lidocaine.  Free flow of clear CSF was obtained prior to injecting local anesthetic into the CSF.  The spinal needle aspirated freely following injection.  The needle was carefully withdrawn.  The patient tolerated the procedure well.

## 2022-06-23 NOTE — Op Note (Signed)
OPERATIVE REPORT  SURGEON: Rod Can, MD   ASSISTANT: Larene Pickett, PA-C  PREOPERATIVE DIAGNOSIS: Primary Right knee arthritis.   POSTOPERATIVE DIAGNOSIS: Primary Right knee arthritis.   PROCEDURE: Computer assisted Right total knee arthroplasty.   IMPLANTS: Zimmer Persona Tivanium Cemented CR femur, size 6. Persona 0 degree Spiked Keel OsseoTi Tibia, size F. Vivacit-E polyethelyene insert, size 11 mm, CR. TM standard patella, size 32 mm.  ANESTHESIA:  MAC, Regional, and Spinal  TOURNIQUET TIME: Not utilized.   ESTIMATED BLOOD LOSS:-200 mL    ANTIBIOTICS: 2g Ancef.  DRAINS: None.  COMPLICATIONS: None   CONDITION: PACU - hemodynamically stable.   BRIEF CLINICAL NOTE: Monica Abbott is a 71 y.o. female with a long-standing history of Right knee arthritis. After failing conservative management, the patient was indicated for total knee arthroplasty. The patient desired a Nickel free knee due to allergy. The risks, benefits, and alternatives to the procedure were explained, and the patient elected to proceed.  PROCEDURE IN DETAIL: Adductor canal block was obtained in the pre-op holding area. Once inside the operative room, spinal anesthesia was obtained, and a foley catheter was inserted. The patient was then positioned and the lower extremity was prepped and draped in the normal sterile surgical fashion.  A time-out was called verifying side and site of surgery. The patient received IV antibiotics within 60 minutes of beginning the procedure. A tourniquet was not utilized.   An anterior approach to the knee was performed utilizing a midvastus arthrotomy. A medial release was performed and the patellar fat pad was excised. Stryker imageless navigation was used to cut the distal femur perpendicular to the mechanical axis. A freehand patellar resection was performed, and the patella was sized an prepared with 3 lug holes.  Nagivation was used to make a neutral proximal tibia  resection, taking 7 mm of bone from the less affected lateral side with 3 degrees of slope. The menisci were excised. A spacer block was placed, and the alignment and balance in extension were confirmed.   The distal femur was sized using the 3-degree external rotation guide referencing the posterior femoral cortex. The appropriate 4-in-1 cutting block was pinned into place. Rotation was checked using Whiteside's line, the epicondylar axis, and then confirmed with a spacer block in flexion. The remaining femoral cuts were performed, taking care to protect the MCL.  The tibia was sized and the trial tray was pinned into place. The remaining trail components were inserted. The knee was stable to varus and valgus stress through a full range of motion. The patella tracked centrally, and the PCL was well balanced. The trial components were removed, and the proximal tibial surface was prepared. Final tibial component was impacted into place.  The tibial insert was inserted. The distal femur was irrigated and dried. The femoral component was cemented into place. Excess cement was cleared. The knee was brought into extension while the cement polymerized. The patellar component was press fit into place. The knee was tested for a final time and found to be well balanced.   The wound was copiously irrigated with Irrisept solution and normal saline using pule lavage.  Marcaine solution was injected into the periarticular soft tissue.  The wound was closed in layers using #1 Vicryl and Stratafix for the fascia, 2-0 Vicryl for the subcutaneous fat, 2-0 Monocryl for the deep dermal layer, 3-0 running Monocryl subcuticular Stitch, and 4-0 Monocryl stay sutures at both ends of the wound. Dermabond was applied to the skin.  Once  the glue was fully dried, an Aquacell Ag and compressive dressing were applied.  The patient was transported to the recovery room in stable condition.  Sponge, needle, and instrument counts were correct  at the end of the case x2.  The patient tolerated the procedure well and there were no known complications.  Please note that a surgical assistant was a medical necessity for this procedure in order to perform it in a safe and expeditious manner. Surgical assistant was necessary to retract the ligaments and vital neurovascular structures to prevent injury to them and also necessary for proper positioning of the limb to allow for anatomic placement of the prosthesis.

## 2022-06-23 NOTE — Transfer of Care (Signed)
Immediate Anesthesia Transfer of Care Note  Patient: Monica Abbott  Procedure(s) Performed: COMPUTER ASSISTED TOTAL KNEE ARTHROPLASTY (Right: Knee)  Patient Location: PACU  Anesthesia Type:Spinal  Level of Consciousness: awake and patient cooperative  Airway & Oxygen Therapy: Patient Spontanous Breathing and Patient connected to face mask  Post-op Assessment: Report given to RN and Post -op Vital signs reviewed and stable  Post vital signs: Reviewed and stable  Last Vitals:  Vitals Value Taken Time  BP    Temp    Pulse    Resp    SpO2      Last Pain:  Vitals:   06/23/22 1132  PainSc: 0-No pain         Complications: No notable events documented.

## 2022-06-23 NOTE — Discharge Instructions (Signed)
 Dr. Brian Swinteck Total Joint Specialist [facility] [address] [place], [place] [address] [phone]  TOTAL KNEE REPLACEMENT POSTOPERATIVE DIRECTIONS    Knee Rehabilitation, Guidelines Following Surgery  Results after knee surgery are often greatly improved when you follow the exercise, range of motion and muscle strengthening exercises prescribed by your doctor. Safety measures are also important to protect the knee from further injury. Any time any of these exercises cause you to have increased pain or swelling in your knee joint, decrease the amount until you are comfortable again and slowly increase them. If you have problems or questions, call your caregiver or physical therapist for advice.   WEIGHT BEARING Weight bearing as tolerated with assist device (walker, cane, etc) as directed, use it as long as suggested by your surgeon or therapist, typically at least 4-6 weeks.  HOME CARE INSTRUCTIONS  Remove items at home which could result in a fall. This includes throw rugs or furniture in walking pathways.  Continue medications as instructed at time of discharge. You may have some home medications which will be placed on hold until you complete the course of blood thinner medication.  You may start showering once you are discharged home but do not submerge the incision under water. Just pat the incision dry and apply a dry gauze dressing on daily. Walk with walker as instructed.  You may resume a sexual relationship in one month or when given the OK by your doctor.  Use walker as long as suggested by your caregivers. Avoid periods of inactivity such as sitting longer than an hour when not asleep. This helps prevent blood clots.  You may put full weight on your legs and walk as much as is comfortable.  You may return to work once you are cleared by your doctor.  Do not drive a car for 6 weeks or until released by you surgeon.  Do not drive while  taking narcotics.  Wear the elastic stockings for three weeks following surgery during the day but you may remove then at night. Make sure you keep all of your appointments after your operation with all of your doctors and caregivers. You should call the office at the above phone number and make an appointment for approximately two weeks after the date of your surgery. Do not remove your surgical dressing. The dressing is waterproof; you may take showers in 3 days, but do not take tub baths or submerge the dressing. Please pick up a stool softener and laxative for home use as long as you are requiring pain medications. ICE to the affected knee every three hours for 30 minutes at a time and then as needed for pain and swelling.  Continue to use ice on the knee for pain and swelling from surgery. You may notice swelling that will progress down to the foot and ankle.  This is normal after surgery.  Elevate the leg when you are not up walking on it.   It is important for you to complete the blood thinner medication as prescribed by your doctor. Continue to use the breathing machine which will help keep your temperature down.  It is common for your temperature to cycle up and down following surgery, especially at night when you are not up moving around and exerting yourself.  The breathing machine keeps your lungs expanded and your temperature down.  RANGE OF MOTION AND STRENGTHENING EXERCISES  Rehabilitation of the knee is important following a knee injury or an   operation. After just a few days of immobilization, the muscles of the thigh which control the knee become weakened and shrink (atrophy). Knee exercises are designed to build up the tone and strength of the thigh muscles and to improve knee motion. Often times heat used for twenty to thirty minutes before working out will loosen up your tissues and help with improving the range of motion but do not use heat for the first two weeks following surgery.  These exercises can be done on a training (exercise) mat, on the floor, on a table or on a bed. Use what ever works the best and is most comfortable for you Knee exercises include:  Leg Lifts - While your knee is still immobilized in a splint or cast, you can do straight leg raises. Lift the leg to 60 degrees, hold for 3 sec, and slowly lower the leg. Repeat 10-20 times 2-3 times daily. Perform this exercise against resistance later as your knee gets better.  Quad and Hamstring Sets - Tighten up the muscle on the front of the thigh (Quad) and hold for 5-10 sec. Repeat this 10-20 times hourly. Hamstring sets are done by pushing the foot backward against an object and holding for 5-10 sec. Repeat as with quad sets.  A rehabilitation program following serious knee injuries can speed recovery and prevent re-injury in the future due to weakened muscles. Contact your doctor or a physical therapist for more information on knee rehabilitation.   POST-OPERATIVE OPIOID TAPER INSTRUCTIONS: It is important to wean off of your opioid medication as soon as possible. If you do not need pain medication after your surgery it is ok to stop day one. Opioids include: Codeine, Hydrocodone(Norco, Vicodin), Oxycodone(Percocet, oxycontin) and hydromorphone amongst others.  Long term and even short term use of opiods can cause: Increased pain response Dependence Constipation Depression Respiratory depression And more.  Withdrawal symptoms can include Flu like symptoms Nausea, vomiting And more Techniques to manage these symptoms Hydrate well Eat regular healthy meals Stay active Use relaxation techniques(deep breathing, meditating, yoga) Do Not substitute Alcohol to help with tapering If you have been on opioids for less than two weeks and do not have pain than it is ok to stop all together.  Plan to wean off of opioids This plan should start within one week post op of your joint replacement. Maintain the same  interval or time between taking each dose and first decrease the dose.  Cut the total daily intake of opioids by one tablet each day Next start to increase the time between doses. The last dose that should be eliminated is the evening dose.    SKILLED REHAB INSTRUCTIONS: If the patient is transferred to a skilled rehab facility following release from the hospital, a list of the current medications will be sent to the facility for the patient to continue.  When discharged from the skilled rehab facility, please have the facility set up the patient's Home Health Physical Therapy prior to being released. Also, the skilled facility will be responsible for providing the patient with their medications at time of release from the facility to include their pain medication, the muscle relaxants, and their blood thinner medication. If the patient is still at the rehab facility at time of the two week follow up appointment, the skilled rehab facility will also need to assist the patient in arranging follow up appointment in our office and any transportation needs.  MAKE SURE YOU:  Understand these instructions.  Will watch   your condition.  Will get help right away if you are not doing well or get worse.    Pick up stool softner and laxative for home use following surgery while on pain medications. Do NOT remove your dressing. You may shower.  Do not take tub baths or submerge incision under water. May shower starting three days after surgery. Please use a clean towel to pat the incision dry following showers. Continue to use ice for pain and swelling after surgery. Do not use any lotions or creams on the incision until instructed by your surgeon.  

## 2022-06-23 NOTE — Anesthesia Postprocedure Evaluation (Signed)
Anesthesia Post Note  Patient: Monica Abbott  Procedure(s) Performed: COMPUTER ASSISTED TOTAL KNEE ARTHROPLASTY (Right: Knee)     Patient location during evaluation: PACU Anesthesia Type: Spinal and Regional Level of consciousness: oriented and awake and alert Pain management: pain level controlled Vital Signs Assessment: post-procedure vital signs reviewed and stable Respiratory status: spontaneous breathing and respiratory function stable Cardiovascular status: blood pressure returned to baseline and stable Postop Assessment: no headache, no backache, no apparent nausea or vomiting, spinal receding and patient able to bend at knees Anesthetic complications: no   No notable events documented.  Last Vitals:  Vitals:   06/23/22 1500 06/23/22 1515  BP: 111/65 (!) 117/43  Pulse: (!) 51 60  Resp: 14 19  Temp:    SpO2: 100% 95%    Last Pain:  Vitals:   06/23/22 1445  PainSc: 0-No pain                 Jassmin Kemmerer,W. EDMOND

## 2022-06-23 NOTE — Progress Notes (Signed)
5 mins post block. 50 mcg fentanyl given @ 11:20

## 2022-06-23 NOTE — Evaluation (Signed)
Physical Therapy Evaluation Patient Details Name: Monica Abbott MRN: [id number] DOB: [birth year] Today's Date: 06/23/2022  History of Present Illness  Pt is a 71yo female presenting s/p R-TKA on 06/23/22. PMH: hx of breast cancer s/p radiation, HTN.  Clinical Impression  Monica Abbott is a 71 y.o. female POD 0 s/p R-TKA. Patient reports independence with mobility at baseline. Patient is now limited by functional impairments (see PT problem list below) and requires supervision for bed mobility and min guard for transfers. Patient was able to ambulate 13 feet with RW and min guard level of assist. Patient instructed in exercise to facilitate ROM and circulation to manage edema. Provided incentive spirometer and with Vcs pt able to achieve 1565m. Patient will benefit from continued skilled PT interventions to address impairments and progress towards PLOF. Acute PT will follow to progress mobility and stair training in preparation for safe discharge home.       Recommendations for follow up therapy are one component of a multi-disciplinary discharge planning process, led by the attending physician.  Recommendations may be updated based on patient status, additional functional criteria and insurance authorization.  Follow Up Recommendations Follow physician's recommendations for discharge plan and follow up therapies      Assistance Recommended at Discharge Frequent or constant Supervision/Assistance  Patient can return home with the following  A little help with walking and/or transfers;A little help with bathing/dressing/bathroom;Assistance with cooking/housework    Equipment Recommendations Rolling walker (2 wheels)  Recommendations for Other Services       Functional Status Assessment Patient has had a recent decline in their functional status and demonstrates the ability to make significant improvements in function in a reasonable and predictable amount of time.     Precautions /  Restrictions Precautions Precautions: Fall Restrictions Weight Bearing Restrictions: No Other Position/Activity Restrictions: wbat      Mobility  Bed Mobility Overal bed mobility: Needs Assistance Bed Mobility: Supine to Sit     Supine to sit: Supervision, HOB elevated     General bed mobility comments: For safety only    Transfers Overall transfer level: Needs assistance Equipment used: Rolling walker (2 wheels) Transfers: Sit to/from Stand Sit to Stand: Min guard           General transfer comment: For safety only, VCs for powering up off bed with BUE and LLE    Ambulation/Gait Ambulation/Gait assistance: Min guard Gait Distance (Feet): 13 Feet Assistive device: Rolling walker (2 wheels) Gait Pattern/deviations: Step-to pattern Gait velocity: decreased     General Gait Details: Pt ambulated with RW and min guard, no overt LOB noted or physical assit required. VCs for sequencing.  Stairs            Wheelchair Mobility    Modified Rankin (Stroke Patients Only)       Balance Overall balance assessment: Needs assistance Sitting-balance support: Feet supported, No upper extremity supported Sitting balance-Leahy Scale: Good     Standing balance support: Reliant on assistive device for balance, During functional activity, Bilateral upper extremity supported Standing balance-Leahy Scale: Poor                               Pertinent Vitals/Pain Pain Assessment Pain Assessment: No/denies pain    Home Living Family/patient expects to be discharged to:: Private residence Living Arrangements: Spouse/significant other Available Help at Discharge: Family;Available 24 hours/day Type of Home: House Home Access: Stairs to enter Entrance Stairs-Rails:  Left Entrance Stairs-Number of Steps: 5   Home Layout: One level Home Equipment: BSC/3in1;Cane - single point (Husband's cane)      Prior Function Prior Level of Function :  Independent/Modified Independent             Mobility Comments: IND ADLs Comments: IND     Hand Dominance        Extremity/Trunk Assessment   Upper Extremity Assessment Upper Extremity Assessment: Overall WFL for tasks assessed    Lower Extremity Assessment Lower Extremity Assessment: RLE deficits/detail;LLE deficits/detail RLE Deficits / Details: MMT ank DF/PF 5/5, no extensor lag noted RLE Sensation: WNL LLE Deficits / Details: MMT ank DF/PF 5/5 LLE Sensation: WNL    Cervical / Trunk Assessment Cervical / Trunk Assessment: Kyphotic  Communication   Communication: No difficulties  Cognition Arousal/Alertness: Awake/alert Behavior During Therapy: WFL for tasks assessed/performed Overall Cognitive Status: Within Functional Limits for tasks assessed                                          General Comments      Exercises Total Joint Exercises Ankle Circles/Pumps: AROM, Both, 10 reps   Assessment/Plan    PT Assessment Patient needs continued PT services  PT Problem List Decreased strength;Decreased range of motion;Decreased activity tolerance;Decreased balance;Decreased mobility;Decreased coordination;Pain       PT Treatment Interventions DME instruction;Gait training;Stair training;Functional mobility training;Therapeutic activities;Therapeutic exercise;Balance training;Neuromuscular re-education;Patient/family education    PT Goals (Current goals can be found in the Care Plan section)  Acute Rehab PT Goals Patient Stated Goal: play with grandkids PT Goal Formulation: With patient Time For Goal Achievement: 06/30/22 Potential to Achieve Goals: Good    Frequency 7X/week     Co-evaluation               AM-PAC PT "6 Clicks" Mobility  Outcome Measure Help needed turning from your back to your side while in a flat bed without using bedrails?: None Help needed moving from lying on your back to sitting on the side of a flat bed  without using bedrails?: A Little Help needed moving to and from a bed to a chair (including a wheelchair)?: A Little Help needed standing up from a chair using your arms (e.g., wheelchair or bedside chair)?: A Little Help needed to walk in hospital room?: A Little Help needed climbing 3-5 steps with a railing? : A Little 6 Click Score: 19    End of Session Equipment Utilized During Treatment: Gait belt Activity Tolerance: Patient tolerated treatment well;No increased pain Patient left: in chair;with call bell/phone within reach;with chair alarm set;with SCD's reapplied Nurse Communication: Mobility status PT Visit Diagnosis: Pain;Difficulty in walking, not elsewhere classified (R26.2) Pain - Right/Left: Right Pain - part of body: Knee    Time: 5784-6962 PT Time Calculation (min) (ACUTE ONLY): 22 min   Charges:   PT Evaluation $PT Eval Low Complexity: Thompson, PT, DPT [facility] Office: [phone] Weekend pager: [phone]  Coolidge Breeze 06/23/2022, 6:20 PM

## 2022-06-23 NOTE — Anesthesia Procedure Notes (Signed)
Anesthesia Regional Block: Adductor canal block   Pre-Anesthetic Checklist: , timeout performed,  Correct Patient, Correct Site, Correct Laterality,  Correct Procedure, Correct Position, site marked,  Risks and benefits discussed,  Pre-op evaluation,  At surgeon's request and post-op pain management  Laterality: Right  Prep: Maximum Sterile Barrier Precautions used, chloraprep       Needles:  Injection technique: Single-shot  Needle Type: Echogenic Stimulator Needle     Needle Length: 9cm  Needle Gauge: 21     Additional Needles:   Procedures:,,,, ultrasound used (permanent image in chart),,    Narrative:  Start time: 06/23/2022 11:12 AM End time: 06/23/2022 11:22 AM Injection made incrementally with aspirations every 5 mL.  Performed by: Personally  Anesthesiologist: Roderic Palau, MD

## 2022-06-23 NOTE — Interval H&P Note (Signed)
History and Physical Interval Note:  06/23/2022 10:34 AM  Monica Abbott  has presented today for surgery, with the diagnosis of Right knee osteoarthritits.  The various methods of treatment have been discussed with the patient and family. After consideration of risks, benefits and other options for treatment, the patient has consented to  Procedure(s) with comments: Waller (Right) - 150 as a surgical intervention.  The patient's history has been reviewed, patient examined, no change in status, stable for surgery.  I have reviewed the patient's chart and labs.  Questions were answered to the patient's satisfaction.     Hilton Cork Whitlee Sluder

## 2022-06-23 NOTE — Anesthesia Preprocedure Evaluation (Addendum)
Anesthesia Evaluation  Patient identified by MRN, date of birth, ID band Patient awake    Reviewed: Allergy & Precautions, H&P , NPO status , Patient's Chart, lab work & pertinent test results  Airway Mallampati: II  TM Distance: >3 FB Neck ROM: Full    Dental no notable dental hx. (+) Edentulous Upper, Edentulous Lower, Dental Advisory Given   Pulmonary neg pulmonary ROS,    Pulmonary exam normal breath sounds clear to auscultation       Cardiovascular hypertension, Pt. on medications  Rhythm:Regular Rate:Normal     Neuro/Psych negative neurological ROS  negative psych ROS   GI/Hepatic negative GI ROS, Neg liver ROS,   Endo/Other  negative endocrine ROS  Renal/GU negative Renal ROS  negative genitourinary   Musculoskeletal  (+) Arthritis , Osteoarthritis,    Abdominal   Peds  Hematology  (+) Blood dyscrasia, anemia ,   Anesthesia Other Findings   Reproductive/Obstetrics negative OB ROS                            Anesthesia Physical Anesthesia Plan  ASA: 2  Anesthesia Plan: Spinal   Post-op Pain Management: Regional block* and Tylenol PO (pre-op)*   Induction: Intravenous  PONV Risk Score and Plan: 3 and Ondansetron, Propofol infusion and Dexamethasone  Airway Management Planned: Natural Airway and Simple Face Mask  Additional Equipment:   Intra-op Plan:   Post-operative Plan: Extubation in OR  Informed Consent: I have reviewed the patients History and Physical, chart, labs and discussed the procedure including the risks, benefits and alternatives for the proposed anesthesia with the patient or authorized representative who has indicated his/her understanding and acceptance.     Dental advisory given  Plan Discussed with: CRNA  Anesthesia Plan Comments:         Anesthesia Quick Evaluation

## 2022-06-24 ENCOUNTER — Encounter (HOSPITAL_COMMUNITY): Payer: Self-pay | Admitting: Orthopedic Surgery

## 2022-06-24 DIAGNOSIS — M1711 Unilateral primary osteoarthritis, right knee: Secondary | ICD-10-CM | POA: Diagnosis not present

## 2022-06-24 DIAGNOSIS — D649 Anemia, unspecified: Secondary | ICD-10-CM | POA: Diagnosis not present

## 2022-06-24 DIAGNOSIS — M2550 Pain in unspecified joint: Secondary | ICD-10-CM | POA: Diagnosis not present

## 2022-06-24 DIAGNOSIS — I10 Essential (primary) hypertension: Secondary | ICD-10-CM | POA: Diagnosis not present

## 2022-06-24 DIAGNOSIS — M2540 Effusion, unspecified joint: Secondary | ICD-10-CM | POA: Diagnosis not present

## 2022-06-24 LAB — BASIC METABOLIC PANEL
Anion gap: 6 (ref 5–15)
Anion gap: 6 (ref 5–15)
BUN: 16 mg/dL (ref 8–23)
BUN: 20 mg/dL (ref 8–23)
CO2: 23 mmol/L (ref 22–32)
CO2: 26 mmol/L (ref 22–32)
Calcium: 8.5 mg/dL — ABNORMAL LOW (ref 8.9–10.3)
Calcium: 8.2 mg/dL — ABNORMAL LOW (ref 8.9–10.3)
Chloride: 111 mmol/L (ref 98–111)
Chloride: 109 mmol/L (ref 98–111)
Creatinine, Ser: 1.1 mg/dL — ABNORMAL HIGH (ref 0.44–1.00)
Creatinine, Ser: 1.04 mg/dL — ABNORMAL HIGH (ref 0.44–1.00)
GFR, Estimated: 54 mL/min — ABNORMAL LOW (ref >60)
GFR, Estimated: 58 mL/min — ABNORMAL LOW (ref >60)
Glucose, Bld: 112 mg/dL — ABNORMAL HIGH (ref 70–99)
Glucose, Bld: 113 mg/dL — ABNORMAL HIGH (ref 70–99)
Potassium: 4 mmol/L (ref 3.5–5.1)
Potassium: 3.8 mmol/L (ref 3.5–5.1)
Sodium: 140 mmol/L (ref 135–145)
Sodium: 141 mmol/L (ref 135–145)

## 2022-06-24 LAB — CBC
HCT: 32.8 % — ABNORMAL LOW (ref 36.0–46.0)
Hemoglobin: 11.1 g/dL — ABNORMAL LOW (ref 12.0–15.0)
MCH: 29.9 pg (ref 26.0–34.0)
MCHC: 33.8 g/dL (ref 30.0–36.0)
MCV: 88.4 fL (ref 80.0–100.0)
Platelets: 210 10*3/uL (ref 150–400)
RBC: 3.71 MIL/uL — ABNORMAL LOW (ref 3.87–5.11)
RDW: 13.1 % (ref 11.5–15.5)
WBC: 13.3 10*3/uL — ABNORMAL HIGH (ref 4.0–10.5)
nRBC: 0 % (ref 0.0–0.2)

## 2022-06-24 MED ORDER — OXYCODONE HCL 5 MG PO TABS: 5.0000 mg | ORAL_TABLET | ORAL | 0 refills | Status: DC | PRN

## 2022-06-24 MED ORDER — DOCUSATE SODIUM 100 MG PO CAPS: 100.0000 mg | ORAL_CAPSULE | Freq: Two times a day (BID) | ORAL | 0 refills | Status: AC

## 2022-06-24 MED ORDER — ASPIRIN 81 MG PO CHEW: 81.0000 mg | CHEWABLE_TABLET | Freq: Two times a day (BID) | ORAL | 0 refills | Status: AC

## 2022-06-24 MED ORDER — SENNA 8.6 MG PO TABS: 2.0000 | ORAL_TABLET | Freq: Every day | ORAL | 0 refills | Status: AC

## 2022-06-24 MED ORDER — ONDANSETRON HCL 4 MG PO TABS: 4.0000 mg | ORAL_TABLET | Freq: Three times a day (TID) | ORAL | 0 refills | Status: AC | PRN

## 2022-06-24 MED ORDER — POLYETHYLENE GLYCOL 3350 17 G PO PACK: 17.0000 g | PACK | Freq: Every day | ORAL | 0 refills | Status: AC | PRN

## 2022-06-24 NOTE — TOC Transition Note (Signed)
Transition of Care [facility]) - CM/SW Discharge Note   Patient Details  Name: Monica Abbott MRN: [id number] Date of Birth: February 01, 1951  [facility] CM/SW Contact:  Lennart Pall, LCSW Phone Number: 06/24/2022, 9:35 AM   Clinical Narrative:     Met with pt and confirming she has received RW to room via [facility].  OPPT already arranged with [facility].  No further needs.  Final next level of care: OP Rehab Barriers to Discharge: No Barriers Identified   Patient Goals and CMS Choice Patient states their goals for this hospitalization and ongoing recovery are:: return home      Discharge Placement                       Discharge Plan and Services                DME Arranged: Walker rolling DME Agency: [employer]       HH Arranged: NA [place] Agency: NA        Social Determinants of Health (SDOH) Interventions     Readmission Risk Interventions     No data to display

## 2022-06-24 NOTE — Progress Notes (Signed)
Physical Therapy Treatment Patient Details Name: Monica Abbott MRN: [id number] DOB: 10-31-1950 Today's Date: 06/24/2022   History of Present Illness Pt is a 71yo female presenting s/p R-TKA on 06/23/22. PMH: hx of breast cancer s/p radiation, HTN.    PT Comments    Pt continues very cooperative and progressing well with mobility.  Pt up to ambulate increased distance in hall, negotiated stairs and performed HEP with written instruction provided and reviewed. Pt hopeful for dc home this date - dependent on lab work.  Recommendations for follow up therapy are one component of a multi-disciplinary discharge planning process, led by the attending physician.  Recommendations may be updated based on patient status, additional functional criteria and insurance authorization.  Follow Up Recommendations  Follow physician's recommendations for discharge plan and follow up therapies     Assistance Recommended at Discharge Frequent or constant Supervision/Assistance  Patient can return home with the following A little help with walking and/or transfers;A little help with bathing/dressing/bathroom;Assistance with cooking/housework;Assist for transportation;Help with stairs or ramp for entrance   Equipment Recommendations  Rolling walker (2 wheels)    Recommendations for Other Services       Precautions / Restrictions Precautions Precautions: Fall Restrictions Weight Bearing Restrictions: No Other Position/Activity Restrictions: wbat     Mobility  Bed Mobility Overal bed mobility: Needs Assistance Bed Mobility: Supine to Sit     Supine to sit: Supervision, HOB elevated     General bed mobility comments: Pt up in recliner and requests back to same    Transfers Overall transfer level: Needs assistance Equipment used: Rolling walker (2 wheels) Transfers: Sit to/from Stand Sit to Stand: Min guard, Supervision           General transfer comment: For safety only, VCs for  powering up off bed with BUE and LLE    Ambulation/Gait Ambulation/Gait assistance: Min guard Gait Distance (Feet): 100 Feet Assistive device: Rolling walker (2 wheels) Gait Pattern/deviations: Step-to pattern, Step-through pattern, Decreased step length - right, Decreased step length - left, Shuffle, Trunk flexed Gait velocity: decreased     General Gait Details: cues for posture, position from RW and intial sequence   Stairs Stairs: Yes Stairs assistance: Min assist Stair Management: One rail Left, Step to pattern, Forwards, With cane Number of Stairs: 3 General stair comments: min cues for sequence and foot/cane placement   Wheelchair Mobility    Modified Rankin (Stroke Patients Only)       Balance Overall balance assessment: Needs assistance Sitting-balance support: Feet supported, No upper extremity supported Sitting balance-Leahy Scale: Good     Standing balance support: No upper extremity supported Standing balance-Leahy Scale: Fair                              Cognition Arousal/Alertness: Awake/alert Behavior During Therapy: WFL for tasks assessed/performed Overall Cognitive Status: Within Functional Limits for tasks assessed                                          Exercises Total Joint Exercises Ankle Circles/Pumps: AROM, Both, 10 reps Quad Sets: AROM, Both, 10 reps, Supine Heel Slides: AROM, Right, Supine, 10 reps Hip ABduction/ADduction: AAROM, AROM, Right, 10 reps, Supine Straight Leg Raises: AAROM, AROM, Right, 10 reps, Supine Long Arc Quad: AROM, Right, 5 reps, Seated Goniometric ROM: AAROM R knee -4 -  70    General Comments        Pertinent Vitals/Pain Pain Assessment Pain Assessment: 0-10 Pain Score: 5  Pain Location: R knee Pain Descriptors / Indicators: Aching, Sore Pain Intervention(s): Limited activity within patient's tolerance, Monitored during session, Premedicated before session, Ice applied     Home Living                          Prior Function            PT Goals (current goals can now be found in the care plan section) Acute Rehab PT Goals Patient Stated Goal: play with grandkids PT Goal Formulation: With patient Time For Goal Achievement: 06/30/22 Potential to Achieve Goals: Good Progress towards PT goals: Progressing toward goals    Frequency    7X/week      PT Plan Current plan remains appropriate    Co-evaluation              AM-PAC PT "6 Clicks" Mobility   Outcome Measure  Help needed turning from your back to your side while in a flat bed without using bedrails?: None Help needed moving from lying on your back to sitting on the side of a flat bed without using bedrails?: A Little Help needed moving to and from a bed to a chair (including a wheelchair)?: A Little Help needed standing up from a chair using your arms (e.g., wheelchair or bedside chair)?: A Little Help needed to walk in hospital room?: A Little Help needed climbing 3-5 steps with a railing? : A Little 6 Click Score: 19    End of Session Equipment Utilized During Treatment: Gait belt Activity Tolerance: Patient tolerated treatment well Patient left: in chair;with call bell/phone within reach;with family/visitor present Nurse Communication: Mobility status PT Visit Diagnosis: Pain;Difficulty in walking, not elsewhere classified (R26.2) Pain - Right/Left: Right Pain - part of body: Knee     Time: 1402-1440 PT Time Calculation (min) (ACUTE ONLY): 38 min  Charges:  $Gait Training: 8-22 mins $Therapeutic Exercise: 8-22 mins $Therapeutic Activity: 8-22 mins                     Debe Coder PT Acute Rehabilitation Services Pager [phone] Office [phone]    Stevan Eberwein 06/24/2022, 2:52 PM

## 2022-06-24 NOTE — Progress Notes (Signed)
Physical Therapy Treatment Patient Details Name: Monica Abbott MRN: [id number] DOB: 1951-03-23 Today's Date: 06/24/2022   History of Present Illness Pt is a 71yo female presenting s/p R-TKA on 06/23/22. PMH: hx of breast cancer s/p radiation, HTN.    PT Comments    Pt very cooperative and progressing steadily with mobility but limited this am by fatigue after sleepless night.  Pt up to ambulate increased distance in hall and HEP initiated.  Pt should progress well to dc home with family assist and reports first OP PT scheduled for 06/28/22.  Recommendations for follow up therapy are one component of a multi-disciplinary discharge planning process, led by the attending physician.  Recommendations may be updated based on patient status, additional functional criteria and insurance authorization.  Follow Up Recommendations  Follow physician's recommendations for discharge plan and follow up therapies     Assistance Recommended at Discharge Frequent or constant Supervision/Assistance  Patient can return home with the following A little help with walking and/or transfers;A little help with bathing/dressing/bathroom;Assistance with cooking/housework;Assist for transportation;Help with stairs or ramp for entrance   Equipment Recommendations  Rolling walker (2 wheels)    Recommendations for Other Services       Precautions / Restrictions Precautions Precautions: Fall Restrictions Weight Bearing Restrictions: No Other Position/Activity Restrictions: wbat     Mobility  Bed Mobility Overal bed mobility: Needs Assistance Bed Mobility: Supine to Sit     Supine to sit: Supervision, HOB elevated     General bed mobility comments: For safety only    Transfers Overall transfer level: Needs assistance Equipment used: Rolling walker (2 wheels) Transfers: Sit to/from Stand Sit to Stand: Min guard           General transfer comment: For safety only, VCs for powering up off bed  with BUE and LLE    Ambulation/Gait Ambulation/Gait assistance: Min guard Gait Distance (Feet): 45 Feet Assistive device: Rolling walker (2 wheels) Gait Pattern/deviations: Step-to pattern Gait velocity: decreased     General Gait Details: Pt ambulated with RW and min guard, no overt LOB noted or physical assit required. VCs for sequencing.   Stairs             Wheelchair Mobility    Modified Rankin (Stroke Patients Only)       Balance Overall balance assessment: Needs assistance Sitting-balance support: Feet supported, No upper extremity supported Sitting balance-Leahy Scale: Good     Standing balance support: Single extremity supported Standing balance-Leahy Scale: Poor                              Cognition Arousal/Alertness: Awake/alert Behavior During Therapy: WFL for tasks assessed/performed Overall Cognitive Status: Within Functional Limits for tasks assessed                                          Exercises Total Joint Exercises Ankle Circles/Pumps: AROM, Both, 10 reps Quad Sets: AROM, Both, 10 reps, Supine Heel Slides: AROM, Right, Supine, 15 reps Hip ABduction/ADduction: AAROM, AROM, Right, 10 reps, Supine Straight Leg Raises: AAROM, AROM, Right, 10 reps, Supine Goniometric ROM: AAROM R knee -4 - 70    General Comments        Pertinent Vitals/Pain Pain Assessment Pain Assessment: 0-10 Pain Score: 5  Pain Location: R knee Pain Descriptors / Indicators: Aching, Sore Pain  Intervention(s): Limited activity within patient's tolerance, Monitored during session, Premedicated before session, Ice applied    Home Living                          Prior Function            PT Goals (current goals can now be found in the care plan section) Acute Rehab PT Goals Patient Stated Goal: play with grandkids PT Goal Formulation: With patient Time For Goal Achievement: 06/30/22 Potential to Achieve Goals:  Good Progress towards PT goals: Progressing toward goals    Frequency    7X/week      PT Plan Current plan remains appropriate    Co-evaluation              AM-PAC PT "6 Clicks" Mobility   Outcome Measure  Help needed turning from your back to your side while in a flat bed without using bedrails?: None Help needed moving from lying on your back to sitting on the side of a flat bed without using bedrails?: A Little Help needed moving to and from a bed to a chair (including a wheelchair)?: A Little Help needed standing up from a chair using your arms (e.g., wheelchair or bedside chair)?: A Little Help needed to walk in hospital room?: A Little Help needed climbing 3-5 steps with a railing? : A Little 6 Click Score: 19    End of Session Equipment Utilized During Treatment: Gait belt Activity Tolerance: Patient tolerated treatment well Patient left: in chair;with call bell/phone within reach;with chair alarm set;with SCD's reapplied Nurse Communication: Mobility status PT Visit Diagnosis: Pain;Difficulty in walking, not elsewhere classified (R26.2) Pain - Right/Left: Right Pain - part of body: Knee     Time: 4536-4680 PT Time Calculation (min) (ACUTE ONLY): 35 min  Charges:  $Gait Training: 8-22 mins $Therapeutic Exercise: 8-22 mins                     Debe Coder PT Acute Rehabilitation Services Pager [phone] Office [phone]    Monica Abbott 06/24/2022, 11:39 AM

## 2022-06-24 NOTE — Progress Notes (Signed)
    Subjective:  Patient reports pain as mild.  Denies N/V/CP/SOB/Abd pain. She states she is not having much pain this morning. She has not required any pain medication overnight, only celebrex. She denies any tingling and numbness in LE bilaterally.   She states she just didn't get much sleep last night.   Objective:   VITALS:   Vitals:   06/23/22 2100 06/24/22 0049 06/24/22 0520 06/24/22 0829  BP: (!) 129/50 (!) 120/51 120/60 131/61  Pulse: 60 71 (!) 59 72  Resp: '12 18 18   '$ Temp: 98.2 F (36.8 C) 98.7 F (37.1 C) 98.4 F (36.9 C) 98.6 F (37 C)  TempSrc: Oral Oral Oral Oral  SpO2: 97% 97% 98% 100%  Weight:      Height:        Patient is lying comfortably in bed. NAD.  Neurologically intact ABD soft Neurovascular intact Sensation intact distally Intact pulses distally Dorsiflexion/Plantar flexion intact Incision: dressing C/D/I No cellulitis present Compartment soft   Lab Results  Component Value Date   WBC 13.3 (H) 06/24/2022   HGB 11.1 (L) 06/24/2022   HCT 32.8 (L) 06/24/2022   MCV 88.4 06/24/2022   PLT 210 06/24/2022   BMET    Component Value Date/Time   NA 140 06/24/2022 0326   K 4.0 06/24/2022 0326   CL 111 06/24/2022 0326   CO2 23 06/24/2022 0326   GLUCOSE 112 (H) 06/24/2022 0326   BUN 16 06/24/2022 0326   CREATININE 1.10 (H) 06/24/2022 0326   CREATININE 0.99 04/12/2018 0804   CALCIUM 8.5 (L) 06/24/2022 0326   GFRNONAA 54 (L) 06/24/2022 0326   GFRNONAA 58 (L) 04/12/2018 0804     Assessment/Plan: 1 Day Post-Op   Principal Problem:   Osteoarthritis of right knee    WBAT with walker DVT ppx: Aspirin, SCDs, TEDS PO pain control PT/OT: Patient ambulated 13 feet with PT yesterday. Continue PT today.  Dispo: Cr 1.10 this morning, slightly elevated from 0.98 06/11/22. Continue IV fluids and recheck BMP at 1400 today. If Cr improves can d/c home today once cleared with PT.    Charlott Rakes PA-C 06/24/2022, 10:00 AM   [facility]  [facility]  [facility] [address], [place], [place] [address] Phone: [phone] [email or web address] Facebook  [employer]

## 2022-06-28 DIAGNOSIS — Z789 Other specified health status: Secondary | ICD-10-CM | POA: Diagnosis not present

## 2022-06-28 DIAGNOSIS — R2689 Other abnormalities of gait and mobility: Secondary | ICD-10-CM | POA: Diagnosis not present

## 2022-06-28 DIAGNOSIS — Z96651 Presence of right artificial knee joint: Secondary | ICD-10-CM | POA: Diagnosis not present

## 2022-06-28 DIAGNOSIS — R262 Difficulty in walking, not elsewhere classified: Secondary | ICD-10-CM | POA: Diagnosis not present

## 2022-06-28 DIAGNOSIS — R52 Pain, unspecified: Secondary | ICD-10-CM | POA: Diagnosis not present

## 2022-06-28 DIAGNOSIS — Z9181 History of falling: Secondary | ICD-10-CM | POA: Diagnosis not present

## 2022-06-28 DIAGNOSIS — M25661 Stiffness of right knee, not elsewhere classified: Secondary | ICD-10-CM | POA: Diagnosis not present

## 2022-06-30 DIAGNOSIS — R52 Pain, unspecified: Secondary | ICD-10-CM | POA: Diagnosis not present

## 2022-06-30 DIAGNOSIS — Z789 Other specified health status: Secondary | ICD-10-CM | POA: Diagnosis not present

## 2022-06-30 DIAGNOSIS — Z96651 Presence of right artificial knee joint: Secondary | ICD-10-CM | POA: Diagnosis not present

## 2022-06-30 DIAGNOSIS — R2689 Other abnormalities of gait and mobility: Secondary | ICD-10-CM | POA: Diagnosis not present

## 2022-06-30 DIAGNOSIS — Z9181 History of falling: Secondary | ICD-10-CM | POA: Diagnosis not present

## 2022-06-30 DIAGNOSIS — R262 Difficulty in walking, not elsewhere classified: Secondary | ICD-10-CM | POA: Diagnosis not present

## 2022-06-30 DIAGNOSIS — M25661 Stiffness of right knee, not elsewhere classified: Secondary | ICD-10-CM | POA: Diagnosis not present

## 2022-07-06 DIAGNOSIS — Z96651 Presence of right artificial knee joint: Secondary | ICD-10-CM | POA: Diagnosis not present

## 2022-07-06 DIAGNOSIS — Z471 Aftercare following joint replacement surgery: Secondary | ICD-10-CM | POA: Diagnosis not present

## 2022-07-07 DIAGNOSIS — R262 Difficulty in walking, not elsewhere classified: Secondary | ICD-10-CM | POA: Diagnosis not present

## 2022-07-07 DIAGNOSIS — M25661 Stiffness of right knee, not elsewhere classified: Secondary | ICD-10-CM | POA: Diagnosis not present

## 2022-07-07 DIAGNOSIS — R52 Pain, unspecified: Secondary | ICD-10-CM | POA: Diagnosis not present

## 2022-07-07 DIAGNOSIS — Z9181 History of falling: Secondary | ICD-10-CM | POA: Diagnosis not present

## 2022-07-07 DIAGNOSIS — Z789 Other specified health status: Secondary | ICD-10-CM | POA: Diagnosis not present

## 2022-07-07 DIAGNOSIS — Z96651 Presence of right artificial knee joint: Secondary | ICD-10-CM | POA: Diagnosis not present

## 2022-07-07 DIAGNOSIS — R2689 Other abnormalities of gait and mobility: Secondary | ICD-10-CM | POA: Diagnosis not present

## 2022-07-12 DIAGNOSIS — R2689 Other abnormalities of gait and mobility: Secondary | ICD-10-CM | POA: Diagnosis not present

## 2022-07-12 DIAGNOSIS — Z96651 Presence of right artificial knee joint: Secondary | ICD-10-CM | POA: Diagnosis not present

## 2022-07-12 DIAGNOSIS — R262 Difficulty in walking, not elsewhere classified: Secondary | ICD-10-CM | POA: Diagnosis not present

## 2022-07-12 DIAGNOSIS — M25661 Stiffness of right knee, not elsewhere classified: Secondary | ICD-10-CM | POA: Diagnosis not present

## 2022-07-12 DIAGNOSIS — Z9181 History of falling: Secondary | ICD-10-CM | POA: Diagnosis not present

## 2022-07-12 DIAGNOSIS — Z789 Other specified health status: Secondary | ICD-10-CM | POA: Diagnosis not present

## 2022-07-12 DIAGNOSIS — R52 Pain, unspecified: Secondary | ICD-10-CM | POA: Diagnosis not present

## 2022-07-14 DIAGNOSIS — Z9181 History of falling: Secondary | ICD-10-CM | POA: Diagnosis not present

## 2022-07-14 DIAGNOSIS — R52 Pain, unspecified: Secondary | ICD-10-CM | POA: Diagnosis not present

## 2022-07-14 DIAGNOSIS — M25661 Stiffness of right knee, not elsewhere classified: Secondary | ICD-10-CM | POA: Diagnosis not present

## 2022-07-14 DIAGNOSIS — Z789 Other specified health status: Secondary | ICD-10-CM | POA: Diagnosis not present

## 2022-07-14 DIAGNOSIS — Z96651 Presence of right artificial knee joint: Secondary | ICD-10-CM | POA: Diagnosis not present

## 2022-07-14 DIAGNOSIS — R2689 Other abnormalities of gait and mobility: Secondary | ICD-10-CM | POA: Diagnosis not present

## 2022-07-14 DIAGNOSIS — R262 Difficulty in walking, not elsewhere classified: Secondary | ICD-10-CM | POA: Diagnosis not present

## 2022-07-19 DIAGNOSIS — R2689 Other abnormalities of gait and mobility: Secondary | ICD-10-CM | POA: Diagnosis not present

## 2022-07-19 DIAGNOSIS — R262 Difficulty in walking, not elsewhere classified: Secondary | ICD-10-CM | POA: Diagnosis not present

## 2022-07-19 DIAGNOSIS — R52 Pain, unspecified: Secondary | ICD-10-CM | POA: Diagnosis not present

## 2022-07-19 DIAGNOSIS — Z96651 Presence of right artificial knee joint: Secondary | ICD-10-CM | POA: Diagnosis not present

## 2022-07-19 DIAGNOSIS — Z789 Other specified health status: Secondary | ICD-10-CM | POA: Diagnosis not present

## 2022-07-19 DIAGNOSIS — Z9181 History of falling: Secondary | ICD-10-CM | POA: Diagnosis not present

## 2022-07-19 DIAGNOSIS — M25661 Stiffness of right knee, not elsewhere classified: Secondary | ICD-10-CM | POA: Diagnosis not present

## 2022-07-23 DIAGNOSIS — R2689 Other abnormalities of gait and mobility: Secondary | ICD-10-CM | POA: Diagnosis not present

## 2022-07-23 DIAGNOSIS — Z9181 History of falling: Secondary | ICD-10-CM | POA: Diagnosis not present

## 2022-07-23 DIAGNOSIS — R52 Pain, unspecified: Secondary | ICD-10-CM | POA: Diagnosis not present

## 2022-07-23 DIAGNOSIS — R262 Difficulty in walking, not elsewhere classified: Secondary | ICD-10-CM | POA: Diagnosis not present

## 2022-07-23 DIAGNOSIS — Z96651 Presence of right artificial knee joint: Secondary | ICD-10-CM | POA: Diagnosis not present

## 2022-07-23 DIAGNOSIS — M25661 Stiffness of right knee, not elsewhere classified: Secondary | ICD-10-CM | POA: Diagnosis not present

## 2022-07-23 DIAGNOSIS — Z789 Other specified health status: Secondary | ICD-10-CM | POA: Diagnosis not present

## 2022-07-28 DIAGNOSIS — R52 Pain, unspecified: Secondary | ICD-10-CM | POA: Diagnosis not present

## 2022-07-28 DIAGNOSIS — R262 Difficulty in walking, not elsewhere classified: Secondary | ICD-10-CM | POA: Diagnosis not present

## 2022-07-28 DIAGNOSIS — M25661 Stiffness of right knee, not elsewhere classified: Secondary | ICD-10-CM | POA: Diagnosis not present

## 2022-07-28 DIAGNOSIS — Z96651 Presence of right artificial knee joint: Secondary | ICD-10-CM | POA: Diagnosis not present

## 2022-07-28 DIAGNOSIS — R2689 Other abnormalities of gait and mobility: Secondary | ICD-10-CM | POA: Diagnosis not present

## 2022-07-28 DIAGNOSIS — Z789 Other specified health status: Secondary | ICD-10-CM | POA: Diagnosis not present

## 2022-07-28 DIAGNOSIS — Z9181 History of falling: Secondary | ICD-10-CM | POA: Diagnosis not present

## 2022-07-30 DIAGNOSIS — R262 Difficulty in walking, not elsewhere classified: Secondary | ICD-10-CM | POA: Diagnosis not present

## 2022-07-30 DIAGNOSIS — Z96651 Presence of right artificial knee joint: Secondary | ICD-10-CM | POA: Diagnosis not present

## 2022-07-30 DIAGNOSIS — M25661 Stiffness of right knee, not elsewhere classified: Secondary | ICD-10-CM | POA: Diagnosis not present

## 2022-07-30 DIAGNOSIS — R2689 Other abnormalities of gait and mobility: Secondary | ICD-10-CM | POA: Diagnosis not present

## 2022-07-30 DIAGNOSIS — Z789 Other specified health status: Secondary | ICD-10-CM | POA: Diagnosis not present

## 2022-07-30 DIAGNOSIS — R52 Pain, unspecified: Secondary | ICD-10-CM | POA: Diagnosis not present

## 2022-07-30 DIAGNOSIS — Z9181 History of falling: Secondary | ICD-10-CM | POA: Diagnosis not present

## 2022-08-02 DIAGNOSIS — M25661 Stiffness of right knee, not elsewhere classified: Secondary | ICD-10-CM | POA: Diagnosis not present

## 2022-08-02 DIAGNOSIS — R52 Pain, unspecified: Secondary | ICD-10-CM | POA: Diagnosis not present

## 2022-08-02 DIAGNOSIS — Z789 Other specified health status: Secondary | ICD-10-CM | POA: Diagnosis not present

## 2022-08-02 DIAGNOSIS — R262 Difficulty in walking, not elsewhere classified: Secondary | ICD-10-CM | POA: Diagnosis not present

## 2022-08-02 DIAGNOSIS — Z9181 History of falling: Secondary | ICD-10-CM | POA: Diagnosis not present

## 2022-08-02 DIAGNOSIS — R2689 Other abnormalities of gait and mobility: Secondary | ICD-10-CM | POA: Diagnosis not present

## 2022-08-02 DIAGNOSIS — H04123 Dry eye syndrome of bilateral lacrimal glands: Secondary | ICD-10-CM | POA: Diagnosis not present

## 2022-08-02 DIAGNOSIS — Z96651 Presence of right artificial knee joint: Secondary | ICD-10-CM | POA: Diagnosis not present

## 2022-08-03 DIAGNOSIS — Z471 Aftercare following joint replacement surgery: Secondary | ICD-10-CM | POA: Diagnosis not present

## 2022-08-03 DIAGNOSIS — Z96651 Presence of right artificial knee joint: Secondary | ICD-10-CM | POA: Diagnosis not present

## 2022-08-05 DIAGNOSIS — Z789 Other specified health status: Secondary | ICD-10-CM | POA: Diagnosis not present

## 2022-08-05 DIAGNOSIS — R262 Difficulty in walking, not elsewhere classified: Secondary | ICD-10-CM | POA: Diagnosis not present

## 2022-08-05 DIAGNOSIS — Z9181 History of falling: Secondary | ICD-10-CM | POA: Diagnosis not present

## 2022-08-05 DIAGNOSIS — Z96651 Presence of right artificial knee joint: Secondary | ICD-10-CM | POA: Diagnosis not present

## 2022-08-05 DIAGNOSIS — M25661 Stiffness of right knee, not elsewhere classified: Secondary | ICD-10-CM | POA: Diagnosis not present

## 2022-08-05 DIAGNOSIS — R2689 Other abnormalities of gait and mobility: Secondary | ICD-10-CM | POA: Diagnosis not present

## 2022-08-05 DIAGNOSIS — R52 Pain, unspecified: Secondary | ICD-10-CM | POA: Diagnosis not present

## 2022-08-09 DIAGNOSIS — R2689 Other abnormalities of gait and mobility: Secondary | ICD-10-CM | POA: Diagnosis not present

## 2022-08-09 DIAGNOSIS — Z9181 History of falling: Secondary | ICD-10-CM | POA: Diagnosis not present

## 2022-08-09 DIAGNOSIS — M25661 Stiffness of right knee, not elsewhere classified: Secondary | ICD-10-CM | POA: Diagnosis not present

## 2022-08-09 DIAGNOSIS — Z96651 Presence of right artificial knee joint: Secondary | ICD-10-CM | POA: Diagnosis not present

## 2022-08-09 DIAGNOSIS — R262 Difficulty in walking, not elsewhere classified: Secondary | ICD-10-CM | POA: Diagnosis not present

## 2022-08-09 DIAGNOSIS — R52 Pain, unspecified: Secondary | ICD-10-CM | POA: Diagnosis not present

## 2022-08-09 DIAGNOSIS — Z789 Other specified health status: Secondary | ICD-10-CM | POA: Diagnosis not present

## 2022-08-12 DIAGNOSIS — M25661 Stiffness of right knee, not elsewhere classified: Secondary | ICD-10-CM | POA: Diagnosis not present

## 2022-08-12 DIAGNOSIS — R52 Pain, unspecified: Secondary | ICD-10-CM | POA: Diagnosis not present

## 2022-08-12 DIAGNOSIS — R262 Difficulty in walking, not elsewhere classified: Secondary | ICD-10-CM | POA: Diagnosis not present

## 2022-08-12 DIAGNOSIS — R2689 Other abnormalities of gait and mobility: Secondary | ICD-10-CM | POA: Diagnosis not present

## 2022-08-12 DIAGNOSIS — Z9181 History of falling: Secondary | ICD-10-CM | POA: Diagnosis not present

## 2022-08-12 DIAGNOSIS — Z789 Other specified health status: Secondary | ICD-10-CM | POA: Diagnosis not present

## 2022-08-12 DIAGNOSIS — Z96651 Presence of right artificial knee joint: Secondary | ICD-10-CM | POA: Diagnosis not present

## 2022-08-17 DIAGNOSIS — Z9181 History of falling: Secondary | ICD-10-CM | POA: Diagnosis not present

## 2022-08-17 DIAGNOSIS — Z96651 Presence of right artificial knee joint: Secondary | ICD-10-CM | POA: Diagnosis not present

## 2022-08-17 DIAGNOSIS — M25661 Stiffness of right knee, not elsewhere classified: Secondary | ICD-10-CM | POA: Diagnosis not present

## 2022-08-17 DIAGNOSIS — R262 Difficulty in walking, not elsewhere classified: Secondary | ICD-10-CM | POA: Diagnosis not present

## 2022-08-17 DIAGNOSIS — Z789 Other specified health status: Secondary | ICD-10-CM | POA: Diagnosis not present

## 2022-08-17 DIAGNOSIS — R2689 Other abnormalities of gait and mobility: Secondary | ICD-10-CM | POA: Diagnosis not present

## 2022-08-17 DIAGNOSIS — R52 Pain, unspecified: Secondary | ICD-10-CM | POA: Diagnosis not present

## 2022-08-27 DIAGNOSIS — R262 Difficulty in walking, not elsewhere classified: Secondary | ICD-10-CM | POA: Diagnosis not present

## 2022-08-27 DIAGNOSIS — Z96651 Presence of right artificial knee joint: Secondary | ICD-10-CM | POA: Diagnosis not present

## 2022-08-27 DIAGNOSIS — R52 Pain, unspecified: Secondary | ICD-10-CM | POA: Diagnosis not present

## 2022-08-27 DIAGNOSIS — Z9181 History of falling: Secondary | ICD-10-CM | POA: Diagnosis not present

## 2022-08-27 DIAGNOSIS — Z789 Other specified health status: Secondary | ICD-10-CM | POA: Diagnosis not present

## 2022-08-27 DIAGNOSIS — R2689 Other abnormalities of gait and mobility: Secondary | ICD-10-CM | POA: Diagnosis not present

## 2022-08-27 DIAGNOSIS — M25661 Stiffness of right knee, not elsewhere classified: Secondary | ICD-10-CM | POA: Diagnosis not present

## 2022-08-30 DIAGNOSIS — R262 Difficulty in walking, not elsewhere classified: Secondary | ICD-10-CM | POA: Diagnosis not present

## 2022-08-30 DIAGNOSIS — M25661 Stiffness of right knee, not elsewhere classified: Secondary | ICD-10-CM | POA: Diagnosis not present

## 2022-08-30 DIAGNOSIS — Z9181 History of falling: Secondary | ICD-10-CM | POA: Diagnosis not present

## 2022-08-30 DIAGNOSIS — R52 Pain, unspecified: Secondary | ICD-10-CM | POA: Diagnosis not present

## 2022-08-30 DIAGNOSIS — R2689 Other abnormalities of gait and mobility: Secondary | ICD-10-CM | POA: Diagnosis not present

## 2022-08-30 DIAGNOSIS — Z96651 Presence of right artificial knee joint: Secondary | ICD-10-CM | POA: Diagnosis not present

## 2022-08-30 DIAGNOSIS — Z789 Other specified health status: Secondary | ICD-10-CM | POA: Diagnosis not present

## 2022-09-09 DIAGNOSIS — H04123 Dry eye syndrome of bilateral lacrimal glands: Secondary | ICD-10-CM | POA: Diagnosis not present

## 2022-09-09 DIAGNOSIS — D3131 Benign neoplasm of right choroid: Secondary | ICD-10-CM | POA: Diagnosis not present

## 2022-09-09 DIAGNOSIS — H2513 Age-related nuclear cataract, bilateral: Secondary | ICD-10-CM | POA: Diagnosis not present

## 2022-09-09 DIAGNOSIS — H40033 Anatomical narrow angle, bilateral: Secondary | ICD-10-CM | POA: Diagnosis not present

## 2022-09-29 DIAGNOSIS — R7303 Prediabetes: Secondary | ICD-10-CM | POA: Diagnosis not present

## 2022-09-29 DIAGNOSIS — E7800 Pure hypercholesterolemia, unspecified: Secondary | ICD-10-CM | POA: Diagnosis not present

## 2022-10-01 DIAGNOSIS — R7303 Prediabetes: Secondary | ICD-10-CM | POA: Diagnosis not present

## 2022-10-01 DIAGNOSIS — I10 Essential (primary) hypertension: Secondary | ICD-10-CM | POA: Diagnosis not present

## 2022-10-01 DIAGNOSIS — E7800 Pure hypercholesterolemia, unspecified: Secondary | ICD-10-CM | POA: Diagnosis not present

## 2022-10-01 DIAGNOSIS — D509 Iron deficiency anemia, unspecified: Secondary | ICD-10-CM | POA: Diagnosis not present

## 2022-10-01 DIAGNOSIS — C50919 Malignant neoplasm of unspecified site of unspecified female breast: Secondary | ICD-10-CM | POA: Diagnosis not present

## 2023-02-21 ENCOUNTER — Other Ambulatory Visit: Payer: Self-pay | Admitting: Hematology and Oncology

## 2023-02-21 DIAGNOSIS — Z1231 Encounter for screening mammogram for malignant neoplasm of breast: Secondary | ICD-10-CM

## 2023-03-12 ENCOUNTER — Other Ambulatory Visit: Payer: Self-pay | Admitting: Hematology and Oncology

## 2023-04-04 DIAGNOSIS — E7800 Pure hypercholesterolemia, unspecified: Secondary | ICD-10-CM | POA: Diagnosis not present

## 2023-04-04 DIAGNOSIS — E559 Vitamin D deficiency, unspecified: Secondary | ICD-10-CM | POA: Diagnosis not present

## 2023-04-04 DIAGNOSIS — D509 Iron deficiency anemia, unspecified: Secondary | ICD-10-CM | POA: Diagnosis not present

## 2023-04-04 DIAGNOSIS — R7303 Prediabetes: Secondary | ICD-10-CM | POA: Diagnosis not present

## 2023-04-06 DIAGNOSIS — D509 Iron deficiency anemia, unspecified: Secondary | ICD-10-CM | POA: Diagnosis not present

## 2023-04-06 DIAGNOSIS — G4700 Insomnia, unspecified: Secondary | ICD-10-CM | POA: Diagnosis not present

## 2023-04-06 DIAGNOSIS — I10 Essential (primary) hypertension: Secondary | ICD-10-CM | POA: Diagnosis not present

## 2023-04-06 DIAGNOSIS — Z1331 Encounter for screening for depression: Secondary | ICD-10-CM | POA: Diagnosis not present

## 2023-04-06 DIAGNOSIS — R7303 Prediabetes: Secondary | ICD-10-CM | POA: Diagnosis not present

## 2023-04-06 DIAGNOSIS — C50919 Malignant neoplasm of unspecified site of unspecified female breast: Secondary | ICD-10-CM | POA: Diagnosis not present

## 2023-04-06 DIAGNOSIS — Z139 Encounter for screening, unspecified: Secondary | ICD-10-CM | POA: Diagnosis not present

## 2023-04-06 DIAGNOSIS — E7800 Pure hypercholesterolemia, unspecified: Secondary | ICD-10-CM | POA: Diagnosis not present

## 2023-04-18 ENCOUNTER — Ambulatory Visit: Payer: Medicare PPO

## 2023-04-20 ENCOUNTER — Ambulatory Visit: Admission: RE | Admit: 2023-04-20 | Payer: Medicare PPO | Source: Ambulatory Visit

## 2023-04-20 DIAGNOSIS — Z1231 Encounter for screening mammogram for malignant neoplasm of breast: Secondary | ICD-10-CM | POA: Diagnosis not present

## 2023-05-09 ENCOUNTER — Inpatient Hospital Stay: Payer: Medicare PPO | Attending: Hematology and Oncology | Admitting: Hematology and Oncology

## 2023-05-09 VITALS — BP 145/70 | HR 60 | Temp 97.3°F | Resp 18 | Ht 63.0 in | Wt 176.5 lb

## 2023-05-09 DIAGNOSIS — Z170 Estrogen receptor positive status [ER+]: Secondary | ICD-10-CM | POA: Diagnosis not present

## 2023-05-09 DIAGNOSIS — C50412 Malignant neoplasm of upper-outer quadrant of left female breast: Secondary | ICD-10-CM | POA: Insufficient documentation

## 2023-05-09 DIAGNOSIS — Z96659 Presence of unspecified artificial knee joint: Secondary | ICD-10-CM | POA: Diagnosis not present

## 2023-05-09 DIAGNOSIS — R232 Flushing: Secondary | ICD-10-CM | POA: Diagnosis not present

## 2023-05-09 DIAGNOSIS — N951 Menopausal and female climacteric states: Secondary | ICD-10-CM | POA: Insufficient documentation

## 2023-05-09 DIAGNOSIS — Z79811 Long term (current) use of aromatase inhibitors: Secondary | ICD-10-CM | POA: Diagnosis not present

## 2023-05-09 DIAGNOSIS — Z780 Asymptomatic menopausal state: Secondary | ICD-10-CM | POA: Diagnosis not present

## 2023-05-09 NOTE — Assessment & Plan Note (Addendum)
Left lumpectomy: IDC grade 1, 1.5 cm, DCIS intermediate grade, negative for lymphovascular or perineural invasion, 1/3 lymph nodes positive with extranodal extension, ER 100%, PR 100%, HER-2 negative, Ki-67 10%, T1c and 1 a stage Ib Mammaprint low risk luminal type a Adjuvant radiation 06/06/2018 to 07/17/2018   Current treatment: Adjuvant antiestrogen therapy with letrozole 2.5 mg daily x7 years started 07/17/2018   Letrozole toxicities:  Decreased memory: Patient was an avid reader  Muscle aches and pains: improved with activity   We will order bone density test to be done next year at breast center   Breast cancer surveillance:  Mammogram 04/21/2023: Benign breast density category A     Return to clinic in 1 year for follow-up

## 2023-05-09 NOTE — Progress Notes (Signed)
Patient Care Team: Hamrick, Durward Fortes, MD as PCP - General (Family Medicine) Harriette Bouillon, MD as Consulting Physician (General Surgery) Serena Croissant, MD as Consulting Physician (Hematology and Oncology) Dorothy Puffer, MD as Consulting Physician (Radiation Oncology)  DIAGNOSIS:  Encounter Diagnoses  Name Primary?   Malignant neoplasm of upper-outer quadrant of left breast in female, estrogen receptor positive (HCC) Yes   Post-menopausal     SUMMARY OF ONCOLOGIC HISTORY: Oncology History  Malignant neoplasm of upper-outer quadrant of left breast in female, estrogen receptor positive (HCC)  03/31/2018 Initial Diagnosis   Screening mammogram detected retroareolar solid and cystic mass at 1 o'clock position measuring 0.7 cm, adjacent smaller mass 0.4 cm not biopsied.  Biopsy of the retroareolar mass is grade 1 invasive ductal carcinoma ER 100%, PR 100%, Ki-67 10%, HER-2 negative, T1b N0 stage Ia AJCC 8   04/24/2018 Genetic Testing   PTCH1 c.324G>A VUS identified on the multi-cancer panel.  The Multi-Gene Panel offered by [facility] includes sequencing and/or deletion duplication testing of the following 84 genes: AIP, ALK, APC, ATM, AXIN2,BAP1,  BARD1, BLM, BMPR1A, BRCA1, BRCA2, BRIP1, CASR, CDC73, CDH1, CDK4, CDKN1B, CDKN1C, CDKN2A (p14ARF), CDKN2A (p16INK4a), CEBPA, CHEK2, CTNNA1, DICER1, DIS3L2, EGFR (c.2369C>T, p.Thr790Met variant only), EPCAM (Deletion/duplication testing only), FH, FLCN, GATA2, GPC3, GREM1 (Promoter region deletion/duplication testing only), HOXB13 (c.251G>A, p.Gly84Glu), HRAS, KIT, MAX, MEN1, MET, MITF (c.952G>A, p.Glu318Lys variant only), MLH1, MSH2, MSH3, MSH6, MUTYH, NBN, NF1, NF2, NTHL1, PALB2, PDGFRA, PHOX2B, PMS2, POLD1, POLE, POT1, PRKAR1A, PTCH1, PTEN, RAD50, RAD51C, RAD51D, RB1, RECQL4, RET, RUNX1, SDHAF2, SDHA (sequence changes only), SDHB, SDHC, SDHD, SMAD4, SMARCA4, SMARCB1, SMARCE1, STK11, SUFU, TERC, TERT, TMEM127, TP53, TSC1, TSC2, VHL, WRN and WT1.  The report date  is April 24, 2018.   04/27/2018 Surgery   Left lumpectomy: IDC grade 1, 1.5 cm, DCIS intermediate grade, negative for lymphovascular or perineural invasion, 1/3 lymph nodes positive with extranodal extension, ER 100%, PR 100%, HER-2 negative, Ki-67 10%, T1cN1a stage Ib Mammaprint  Low risk   06/06/2018 - 07/17/2018 Radiation Therapy   Adjuvant radiation therapy   07/2018 -  Anti-estrogen oral therapy   Letrozole daily     CHIEF COMPLIANT:   Discussed the use of AI scribe software for clinical note transcription with the patient, who gave verbal consent to proceed.  History of Present Illness   The patient, a breast cancer survivor, is five years post-diagnosis and currently on letrozole. She reports tolerating the medication well with infrequent hot flashes, which seem to be triggered by emotional upset rather than the medication. She has no breast pain or discomfort and recent mammogram results were good.  The patient also reports memory issues, which she manages by reading and playing fast-paced card games to stimulate her brain. She does not believe these issues have worsened and considers them a part of her current life. She acknowledges aging as a potential factor in these memory issues.  In addition to her cancer history, the patient has undergone knee replacement surgery since her last visit. She reports a significant improvement in her knee function, estimating a 95% improvement. She no longer takes oxycodone or Tylenol for pain management related to this issue.  The patient also takes trazodone, half a tablet as needed, and has discontinued Celebrex, which was previously used for knee pain.         ALLERGIES:  has no active allergies.  MEDICATIONS:  Current Outpatient Medications  Medication Sig Dispense Refill   atorvastatin (LIPITOR) 10 MG tablet Take 10 mg  by mouth at bedtime.     Carboxymethylcellulose Sodium (THERATEARS OP) Place 1 drop into both eyes in the  morning and at bedtime.     Cholecalciferol (VITAMIN D3) 125 MCG (5000 UT) TABS Take 5,000 Units by mouth in the morning.     letrozole (FEMARA) 2.5 MG tablet TAKE 1 TABLET BY MOUTH EVERY DAY 90 tablet 3   lisinopril (PRINIVIL,ZESTRIL) 10 MG tablet Take 10 mg by mouth in the morning.     Multiple Vitamin (MULTIVITAMIN WITH MINERALS) TABS tablet Take 1 tablet by mouth in the morning. Senior Multivitamin     Omega-3 Fatty Acids (FISH OIL) 1000 MG CAPS Take by mouth in the morning.     ondansetron (ZOFRAN) 4 MG tablet Take 1 tablet (4 mg total) by mouth every 8 (eight) hours as needed for nausea or vomiting. 30 tablet 0   oxybutynin (DITROPAN) 5 MG tablet Take 5 mg by mouth 2 (two) times daily.     traZODone (DESYREL) 50 MG tablet Take 25-50 mg by mouth at bedtime.     TURMERIC CURCUMIN PO Take 1,000 mg by mouth in the morning.     No current facility-administered medications for this visit.    PHYSICAL EXAMINATION: ECOG PERFORMANCE STATUS: 1 - Symptomatic but completely ambulatory  Vitals:   05/09/23 0951  BP: (!) 145/70  Pulse: 60  Resp: 18  Temp: (!) 97.3 F (36.3 C)  SpO2: 100%   Filed Weights   05/09/23 0951  Weight: 176 lb 8 oz (80.1 kg)    Physical Exam          (exam performed in the presence of a chaperone)  LABORATORY DATA:  I have reviewed the data as listed    Latest Ref Rng & Units 06/24/2022    1:47 PM 06/24/2022    3:26 AM 06/11/2022   10:17 AM  CMP  Glucose 70 - 99 mg/dL 010  272  98   BUN 8 - 23 mg/dL 20  16  20    Creatinine 0.44 - 1.00 mg/dL 5.36  6.44  0.34   Sodium 135 - 145 mmol/L 141  140  137   Potassium 3.5 - 5.1 mmol/L 3.8  4.0  4.0   Chloride 98 - 111 mmol/L 109  111  104   CO2 22 - 32 mmol/L 26  23  27    Calcium 8.9 - 10.3 mg/dL 8.2  8.5  9.4     Lab Results  Component Value Date   WBC 13.3 (H) 06/24/2022   HGB 11.1 (L) 06/24/2022   HCT 32.8 (L) 06/24/2022   MCV 88.4 06/24/2022   PLT 210 06/24/2022   NEUTROABS 8.6 (H) 04/15/2021     ASSESSMENT & PLAN:  Malignant neoplasm of upper-outer quadrant of left breast in female, estrogen receptor positive (HCC) Left lumpectomy: IDC grade 1, 1.5 cm, DCIS intermediate grade, negative for lymphovascular or perineural invasion, 1/3 lymph nodes positive with extranodal extension, ER 100%, PR 100%, HER-2 negative, Ki-67 10%, T1c and 1 a stage Ib Mammaprint low risk luminal type a Adjuvant radiation 06/06/2018 to 07/17/2018   Current treatment: Adjuvant antiestrogen therapy with letrozole 2.5 mg daily x7 years started 07/17/2018   Letrozole toxicities:  Decreased memory: Patient was an avid reader  Muscle aches and pains: improved with activity   We will order bone density test to be done next year at breast center   Breast cancer surveillance:  Mammogram 04/21/2023: Benign breast density category A  Return to clinic in 1 year for follow-up ------------------------------------- Assessment and Plan    Breast Cancer Five years post-diagnosis, tolerating Letrozole well with minimal hot flashes. No breast pain or discomfort. Recent mammogram showed no abnormalities. -Continue Letrozole for two more years to complete a total of seven years of therapy.  Memory Issues No worsening of symptoms. Patient is engaging in activities to stimulate brain function. -No changes to current management.  Knee Replacement Patient reports 95% improvement post-surgery. No longer taking oxycodone or Tylenol. -Continue current management.  Bone Health Last bone density scan was good. Patient is engaging in physical activities to maintain bone health. -Order bone density scan for February 2025.  Follow-up in one year.          Orders Placed This Encounter  Procedures   DG Bone Density    Standing Status:   Future    Standing Expiration Date:   05/08/2024    Order Specific Question:   Reason for Exam (SYMPTOM  OR DIAGNOSIS REQUIRED)    Answer:   Post menopausal    Order Specific  Question:   Preferred imaging location?    Answer:   [facility]    Order Specific Question:   Release to patient    Answer:   Immediate   The patient has a good understanding of the overall plan. she agrees with it. she will call with any problems that may develop before the next visit here. Total time spent: 30 mins including face to face time and time spent for planning, charting and co-ordination of care   Tamsen Meek, MD 05/09/23

## 2023-09-12 DIAGNOSIS — D3131 Benign neoplasm of right choroid: Secondary | ICD-10-CM | POA: Diagnosis not present

## 2023-09-12 DIAGNOSIS — H40033 Anatomical narrow angle, bilateral: Secondary | ICD-10-CM | POA: Diagnosis not present

## 2023-09-12 DIAGNOSIS — H04123 Dry eye syndrome of bilateral lacrimal glands: Secondary | ICD-10-CM | POA: Diagnosis not present

## 2023-09-12 DIAGNOSIS — H2513 Age-related nuclear cataract, bilateral: Secondary | ICD-10-CM | POA: Diagnosis not present

## 2023-10-26 DIAGNOSIS — R7303 Prediabetes: Secondary | ICD-10-CM | POA: Diagnosis not present

## 2023-10-26 DIAGNOSIS — E7800 Pure hypercholesterolemia, unspecified: Secondary | ICD-10-CM | POA: Diagnosis not present

## 2023-10-26 DIAGNOSIS — E559 Vitamin D deficiency, unspecified: Secondary | ICD-10-CM | POA: Diagnosis not present

## 2023-10-26 DIAGNOSIS — D509 Iron deficiency anemia, unspecified: Secondary | ICD-10-CM | POA: Diagnosis not present

## 2023-11-01 DIAGNOSIS — G4700 Insomnia, unspecified: Secondary | ICD-10-CM | POA: Diagnosis not present

## 2023-11-01 DIAGNOSIS — Z9181 History of falling: Secondary | ICD-10-CM | POA: Diagnosis not present

## 2023-11-01 DIAGNOSIS — R7303 Prediabetes: Secondary | ICD-10-CM | POA: Diagnosis not present

## 2023-11-01 DIAGNOSIS — E7800 Pure hypercholesterolemia, unspecified: Secondary | ICD-10-CM | POA: Diagnosis not present

## 2023-11-01 DIAGNOSIS — Z23 Encounter for immunization: Secondary | ICD-10-CM | POA: Diagnosis not present

## 2023-11-01 DIAGNOSIS — D509 Iron deficiency anemia, unspecified: Secondary | ICD-10-CM | POA: Diagnosis not present

## 2023-11-01 DIAGNOSIS — C50919 Malignant neoplasm of unspecified site of unspecified female breast: Secondary | ICD-10-CM | POA: Diagnosis not present

## 2023-11-01 DIAGNOSIS — I10 Essential (primary) hypertension: Secondary | ICD-10-CM | POA: Diagnosis not present

## 2024-01-02 ENCOUNTER — Ambulatory Visit
Admission: RE | Admit: 2024-01-02 | Discharge: 2024-01-02 | Disposition: A | Discharge status: Home / Self Care | Payer: Medicare PPO | Source: Ambulatory Visit | Attending: Hematology and Oncology

## 2024-01-02 DIAGNOSIS — M8588 Other specified disorders of bone density and structure, other site: Secondary | ICD-10-CM | POA: Diagnosis not present

## 2024-01-02 DIAGNOSIS — Z780 Asymptomatic menopausal state: Secondary | ICD-10-CM

## 2024-01-02 DIAGNOSIS — N958 Other specified menopausal and perimenopausal disorders: Secondary | ICD-10-CM | POA: Diagnosis not present

## 2024-03-14 ENCOUNTER — Other Ambulatory Visit: Payer: Self-pay | Admitting: Hematology and Oncology

## 2024-03-14 DIAGNOSIS — Z1231 Encounter for screening mammogram for malignant neoplasm of breast: Secondary | ICD-10-CM

## 2024-04-20 ENCOUNTER — Ambulatory Visit
Admission: RE | Admit: 2024-04-20 | Discharge: 2024-04-20 | Disposition: A | Discharge status: Home / Self Care | Source: Ambulatory Visit | Attending: Hematology and Oncology | Admitting: Hematology and Oncology

## 2024-04-20 DIAGNOSIS — Z1231 Encounter for screening mammogram for malignant neoplasm of breast: Secondary | ICD-10-CM

## 2024-05-02 DIAGNOSIS — E559 Vitamin D deficiency, unspecified: Secondary | ICD-10-CM | POA: Diagnosis not present

## 2024-05-02 DIAGNOSIS — E7800 Pure hypercholesterolemia, unspecified: Secondary | ICD-10-CM | POA: Diagnosis not present

## 2024-05-02 DIAGNOSIS — R7303 Prediabetes: Secondary | ICD-10-CM | POA: Diagnosis not present

## 2024-05-07 DIAGNOSIS — R7303 Prediabetes: Secondary | ICD-10-CM | POA: Diagnosis not present

## 2024-05-07 DIAGNOSIS — Z1331 Encounter for screening for depression: Secondary | ICD-10-CM | POA: Diagnosis not present

## 2024-05-07 DIAGNOSIS — M25551 Pain in right hip: Secondary | ICD-10-CM | POA: Diagnosis not present

## 2024-05-07 DIAGNOSIS — Z23 Encounter for immunization: Secondary | ICD-10-CM | POA: Diagnosis not present

## 2024-05-07 DIAGNOSIS — N3281 Overactive bladder: Secondary | ICD-10-CM | POA: Diagnosis not present

## 2024-05-07 DIAGNOSIS — I10 Essential (primary) hypertension: Secondary | ICD-10-CM | POA: Diagnosis not present

## 2024-05-07 DIAGNOSIS — D509 Iron deficiency anemia, unspecified: Secondary | ICD-10-CM | POA: Diagnosis not present

## 2024-05-07 DIAGNOSIS — C50919 Malignant neoplasm of unspecified site of unspecified female breast: Secondary | ICD-10-CM | POA: Diagnosis not present

## 2024-05-07 DIAGNOSIS — E7800 Pure hypercholesterolemia, unspecified: Secondary | ICD-10-CM | POA: Diagnosis not present

## 2024-05-08 ENCOUNTER — Inpatient Hospital Stay: Payer: Medicare PPO | Attending: Hematology and Oncology | Admitting: Hematology and Oncology

## 2024-05-08 VITALS — BP 118/78 | HR 68 | Temp 98.0°F | Resp 18 | Ht 62.0 in | Wt 175.3 lb

## 2024-05-08 DIAGNOSIS — Z17411 Hormone receptor positive with human epidermal growth factor receptor 2 negative status: Secondary | ICD-10-CM | POA: Diagnosis not present

## 2024-05-08 DIAGNOSIS — Z170 Estrogen receptor positive status [ER+]: Secondary | ICD-10-CM

## 2024-05-08 DIAGNOSIS — C50412 Malignant neoplasm of upper-outer quadrant of left female breast: Secondary | ICD-10-CM | POA: Insufficient documentation

## 2024-05-08 DIAGNOSIS — M8580 Other specified disorders of bone density and structure, unspecified site: Secondary | ICD-10-CM | POA: Insufficient documentation

## 2024-05-08 DIAGNOSIS — Z79811 Long term (current) use of aromatase inhibitors: Secondary | ICD-10-CM | POA: Diagnosis not present

## 2024-05-08 MED ORDER — LETROZOLE 2.5 MG PO TABS: 2.5000 mg | ORAL_TABLET | Freq: Every day | ORAL | 3 refills | Status: AC

## 2024-05-08 NOTE — Progress Notes (Signed)
 Patient Care Team: Hamrick, Charlene CROME, MD as PCP - General (Family Medicine) Vanderbilt Ned, MD as Consulting Physician (General Surgery) Odean Potts, MD as Consulting Physician (Hematology and Oncology) Dewey Rush, MD as Consulting Physician (Radiation Oncology)  DIAGNOSIS:  Encounter Diagnosis  Name Primary?   Malignant neoplasm of upper-outer quadrant of left breast in female, estrogen receptor positive (HCC) Yes    SUMMARY OF ONCOLOGIC HISTORY: Oncology History  Malignant neoplasm of upper-outer quadrant of left breast in female, estrogen receptor positive (HCC)  03/31/2018 Initial Diagnosis   Screening mammogram detected retroareolar solid and cystic mass at 1 o'clock position measuring 0.7 cm, adjacent smaller mass 0.4 cm not biopsied.  Biopsy of the retroareolar mass is grade 1 invasive ductal carcinoma ER 100%, PR 100%, Ki-67 10%, HER-2 negative, T1b N0 stage Ia AJCC 8   04/24/2018 Genetic Testing   PTCH1 c.324G>A VUS identified on the multi-cancer panel.  The Multi-Gene Panel offered by [facility] includes sequencing and/or deletion duplication testing of the following 84 genes: AIP, ALK, APC, ATM, AXIN2,BAP1,  BARD1, BLM, BMPR1A, BRCA1, BRCA2, BRIP1, CASR, CDC73, CDH1, CDK4, CDKN1B, CDKN1C, CDKN2A (p14ARF), CDKN2A (p16INK4a), CEBPA, CHEK2, CTNNA1, DICER1, DIS3L2, EGFR (c.2369C>T, p.Thr790Met variant only), EPCAM (Deletion/duplication testing only), FH, FLCN, GATA2, GPC3, GREM1 (Promoter region deletion/duplication testing only), HOXB13 (c.251G>A, p.Gly84Glu), HRAS, KIT, MAX, MEN1, MET, MITF (c.952G>A, p.Glu318Lys variant only), MLH1, MSH2, MSH3, MSH6, MUTYH, NBN, NF1, NF2, NTHL1, PALB2, PDGFRA, PHOX2B, PMS2, POLD1, POLE, POT1, PRKAR1A, PTCH1, PTEN, RAD50, RAD51C, RAD51D, RB1, RECQL4, RET, RUNX1, SDHAF2, SDHA (sequence changes only), SDHB, SDHC, SDHD, SMAD4, SMARCA4, SMARCB1, SMARCE1, STK11, SUFU, TERC, TERT, TMEM127, TP53, TSC1, TSC2, VHL, WRN and WT1.  The report date is April 24, 2018.   04/27/2018 Surgery   Left lumpectomy: IDC grade 1, 1.5 cm, DCIS intermediate grade, negative for lymphovascular or perineural invasion, 1/3 lymph nodes positive with extranodal extension, ER 100%, PR 100%, HER-2 negative, Ki-67 10%, T1cN1a stage Ib Mammaprint  Low risk   06/06/2018 - 07/17/2018 Radiation Therapy   Adjuvant radiation therapy   07/2018 -  Anti-estrogen oral therapy   Letrozole  daily     CHIEF COMPLIANT: Follow-up letrozole  therapy  HISTORY OF PRESENT ILLNESS:  History of Present Illness Monica Abbott is a 73 year old female who presents for a follow-up on hormone therapy.  She has been on anastrozole for nearly six years following breast cancer treatment and reports no significant side effects, such as hot flashes. She denies any chest pain or discomfort.  Her last mammogram was conducted last month. A bone density test in May showed a slight decrease in her right hip, with her spine reported as strong. She takes vitamin D and calcium  supplements, which may contribute to her bone health. She started letrozole  in January 2020 after completing her initial treatment in December 2019.     ALLERGIES:  has no active allergies.  MEDICATIONS:  Current Outpatient Medications  Medication Sig Dispense Refill   atorvastatin  (LIPITOR) 10 MG tablet Take 10 mg by mouth at bedtime.     Carboxymethylcellulose Sodium (THERATEARS OP) Place 1 drop into both eyes in the morning and at bedtime.     Cholecalciferol  (VITAMIN D3) 125 MCG (5000 UT) TABS Take 5,000 Units by mouth in the morning.     letrozole  (FEMARA ) 2.5 MG tablet TAKE 1 TABLET BY MOUTH EVERY DAY 90 tablet 3   lisinopril (PRINIVIL,ZESTRIL) 10 MG tablet Take 10 mg by mouth in the morning.     Multiple Vitamin (MULTIVITAMIN  WITH MINERALS) TABS tablet Take 1 tablet by mouth in the morning. Senior Multivitamin     Omega-3 Fatty Acids (FISH OIL) 1000 MG CAPS Take by mouth in the morning.     oxybutynin  (DITROPAN ) 5  MG tablet Take 5 mg by mouth 2 (two) times daily.     traZODone (DESYREL) 50 MG tablet Take 25-50 mg by mouth at bedtime.     TURMERIC CURCUMIN PO Take 1,000 mg by mouth in the morning.     No current facility-administered medications for this visit.    PHYSICAL EXAMINATION: ECOG PERFORMANCE STATUS: 1 - Symptomatic but completely ambulatory  Vitals:   05/08/24 1017  BP: 118/78  Pulse: 68  Resp: 18  Temp: 98 F (36.7 C)  SpO2: 100%   Filed Weights   05/08/24 1017  Weight: 175 lb 4.8 oz (79.5 kg)     LABORATORY DATA:  I have reviewed the data as listed    Latest Ref Rng & Units 06/24/2022    1:47 PM 06/24/2022    3:26 AM 06/11/2022   10:17 AM  CMP  Glucose 70 - 99 mg/dL 886  887  98   BUN 8 - 23 mg/dL 20  16  20    Creatinine 0.44 - 1.00 mg/dL 8.95  8.89  9.01   Sodium 135 - 145 mmol/L 141  140  137   Potassium 3.5 - 5.1 mmol/L 3.8  4.0  4.0   Chloride 98 - 111 mmol/L 109  111  104   CO2 22 - 32 mmol/L 26  23  27    Calcium  8.9 - 10.3 mg/dL 8.2  8.5  9.4     Lab Results  Component Value Date   WBC 13.3 (H) 06/24/2022   HGB 11.1 (L) 06/24/2022   HCT 32.8 (L) 06/24/2022   MCV 88.4 06/24/2022   PLT 210 06/24/2022   NEUTROABS 8.6 (H) 04/15/2021    ASSESSMENT & PLAN:  Malignant neoplasm of upper-outer quadrant of left breast in female, estrogen receptor positive (HCC) Left lumpectomy: IDC grade 1, 1.5 cm, DCIS intermediate grade, negative for lymphovascular or perineural invasion, 1/3 lymph nodes positive with extranodal extension, ER 100%, PR 100%, HER-2 negative, Ki-67 10%, T1c and 1 a stage Ib Mammaprint low risk luminal type a Adjuvant radiation 06/06/2018 to 07/17/2018   Current treatment: Adjuvant antiestrogen therapy with letrozole  2.5 mg daily x7 years started 07/17/2018   Letrozole  toxicities:  Decreased memory: Patient was an avid reader  Muscle aches and pains: improved with activity   Breast cancer surveillance:  Mammogram 04/20/2024: Benign breast  density category B Bone density 01/02/2024: T-score -1.2: Mild osteopenia   Return to clinic in 1 year for follow-up ------------------------------------- Assessment and Plan Assessment & Plan Hormone receptor-positive breast cancer, status post treatment, on adjuvant endocrine therapy Six years on letrozole  with favorable mammogram and bone density results. Low recurrence risk with 90% chance of no events in ten years. Benefits of therapy expected to last 10-15 years post-cessation. No significant benefit in extending therapy beyond seven years. Considered low recurrence risk and psychological impact of discontinuation. - Continue letrozole  for one more year. - Send a year's worth of refills to [facility] in [facility]. - Continue regular mammograms and bone density monitoring. - Reassess in one year.      No orders of the defined types were placed in this encounter.  The patient has a good understanding of the overall plan. she agrees with it. she will call with any problems  that may develop before the next visit here. Total time spent: 30 mins including face to face time and time spent for planning, charting and co-ordination of care   Naomi MARLA Chad, MD 05/08/24

## 2024-05-08 NOTE — Assessment & Plan Note (Signed)
 Left lumpectomy: IDC grade 1, 1.5 cm, DCIS intermediate grade, negative for lymphovascular or perineural invasion, 1/3 lymph nodes positive with extranodal extension, ER 100%, PR 100%, HER-2 negative, Ki-67 10%, T1c and 1 a stage Ib Mammaprint low risk luminal type a Adjuvant radiation 06/06/2018 to 07/17/2018   Current treatment: Adjuvant antiestrogen therapy with letrozole  2.5 mg daily x7 years started 07/17/2018   Letrozole  toxicities:  Decreased memory: Patient was an avid reader  Muscle aches and pains: improved with activity   Breast cancer surveillance:  Mammogram 04/20/2024: Benign breast density category B Bone density 01/02/2024: T-score -1.2: Mild osteopenia   Return to clinic in 1 year for follow-up

## 2024-05-23 DIAGNOSIS — Z23 Encounter for immunization: Secondary | ICD-10-CM | POA: Diagnosis not present

## 2024-05-23 DIAGNOSIS — Z7185 Encounter for immunization safety counseling: Secondary | ICD-10-CM | POA: Diagnosis not present

## 2024-06-12 DIAGNOSIS — Z23 Encounter for immunization: Secondary | ICD-10-CM | POA: Diagnosis not present

## 2025-05-09 ENCOUNTER — Ambulatory Visit: Admitting: Hematology and Oncology
